# Patient Record
Sex: Female | Born: 1965 | Race: White | Hispanic: No | Marital: Married
Health system: Midwestern US, Academic
[De-identification: ages and names within clinical notes are randomized; demographics above are authoritative.]

---

## 2015-02-16 IMAGING — MG MAMMOGRAM 3D SCREEN BILATERAL
8 series · 8 of 8 positions shown · non-contrast
Comparison: Mammogram September 27, 2010, ultrasound left breast [DATE],

MAMMOGRAM DIGITAL SCREENING BILATERAL
INDICATION: Screening.
History of left biopsy.
Family history of carcinoma of the breast in paternal aunt.
TECHNIQUE: Four standard projections with CAD.
Four standard projections with tomosynthesis.

[R CC]
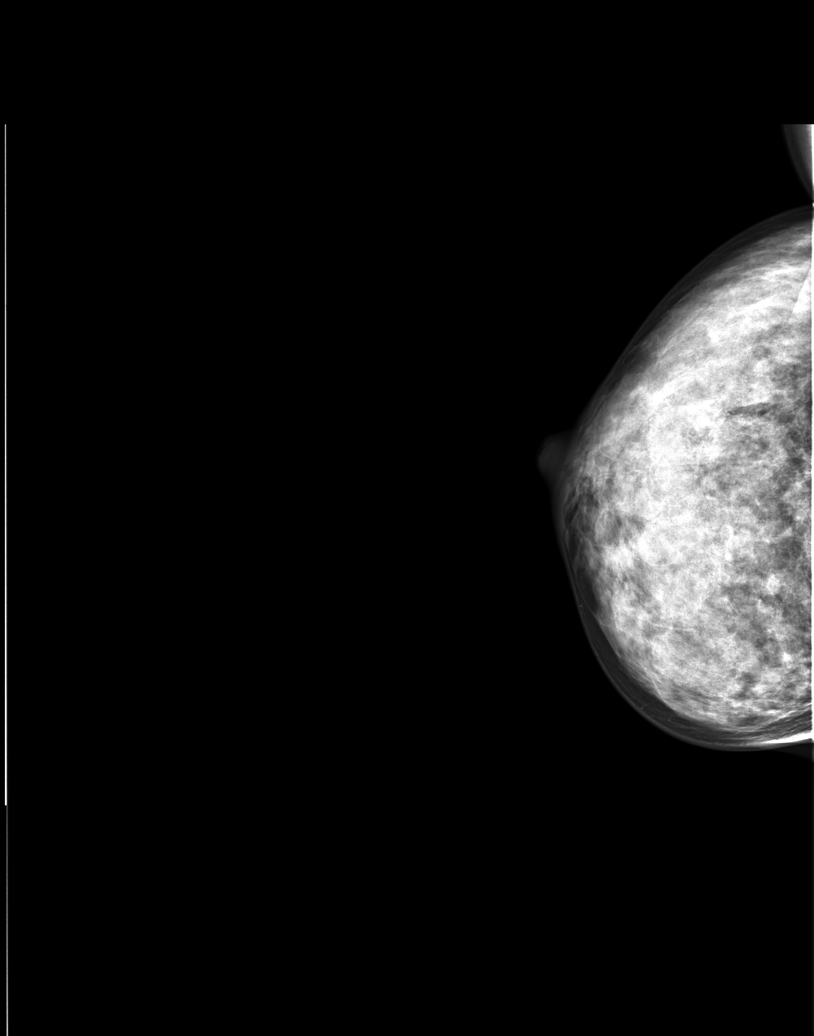

[R tomo (1 of 2)]
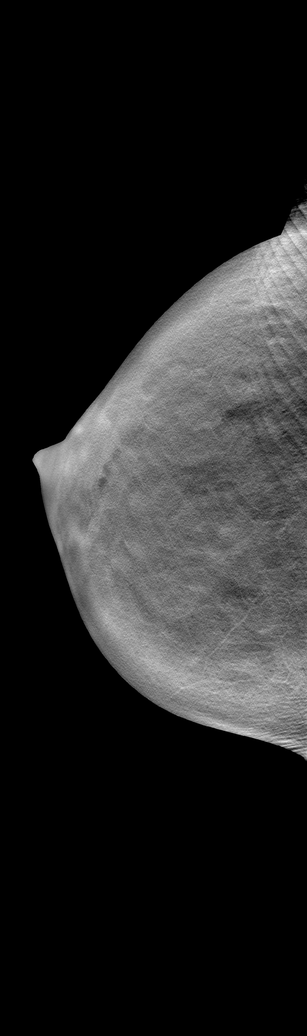

[L CC]
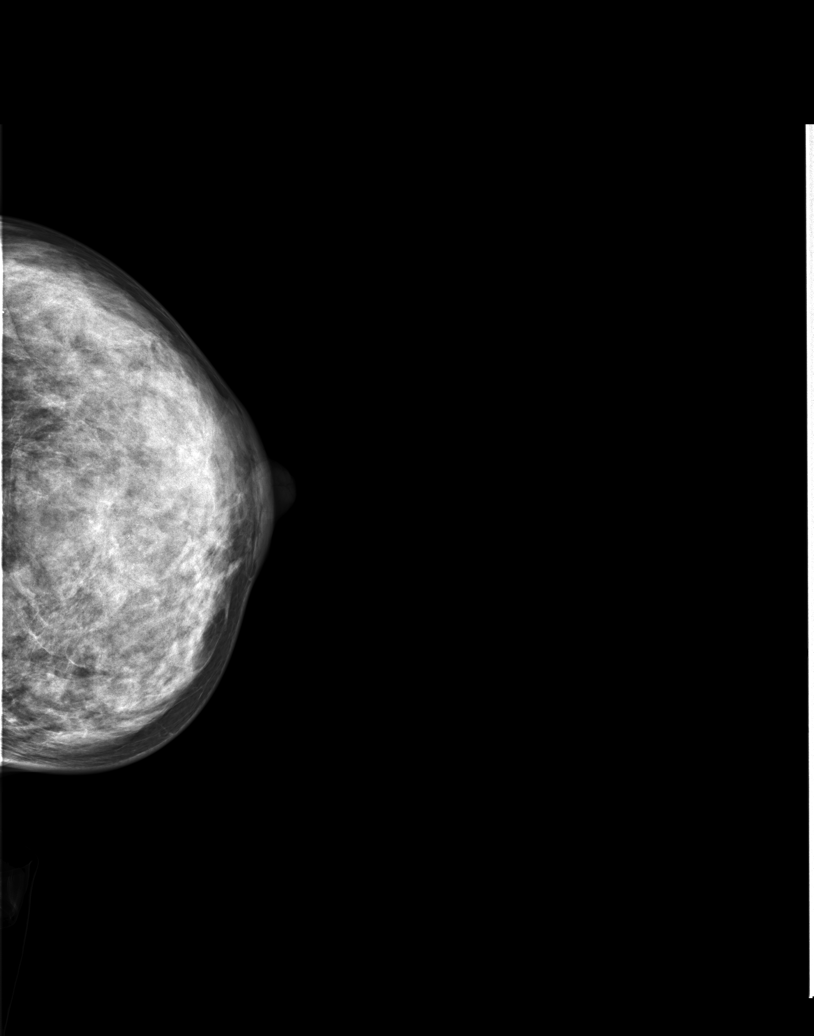

[L tomo (1 of 2)]
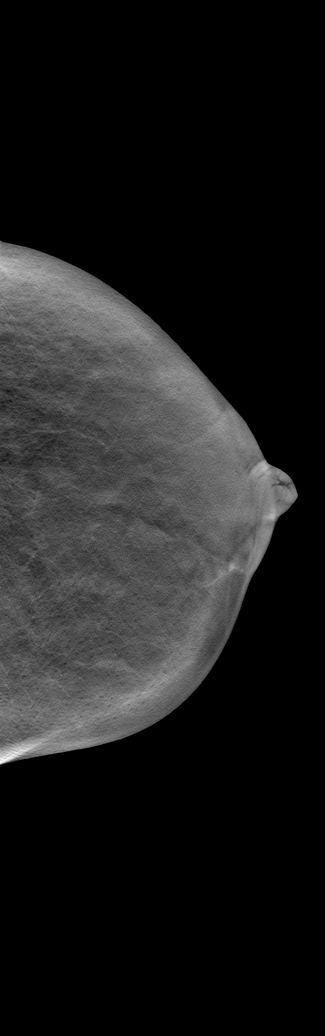

[R MLO]
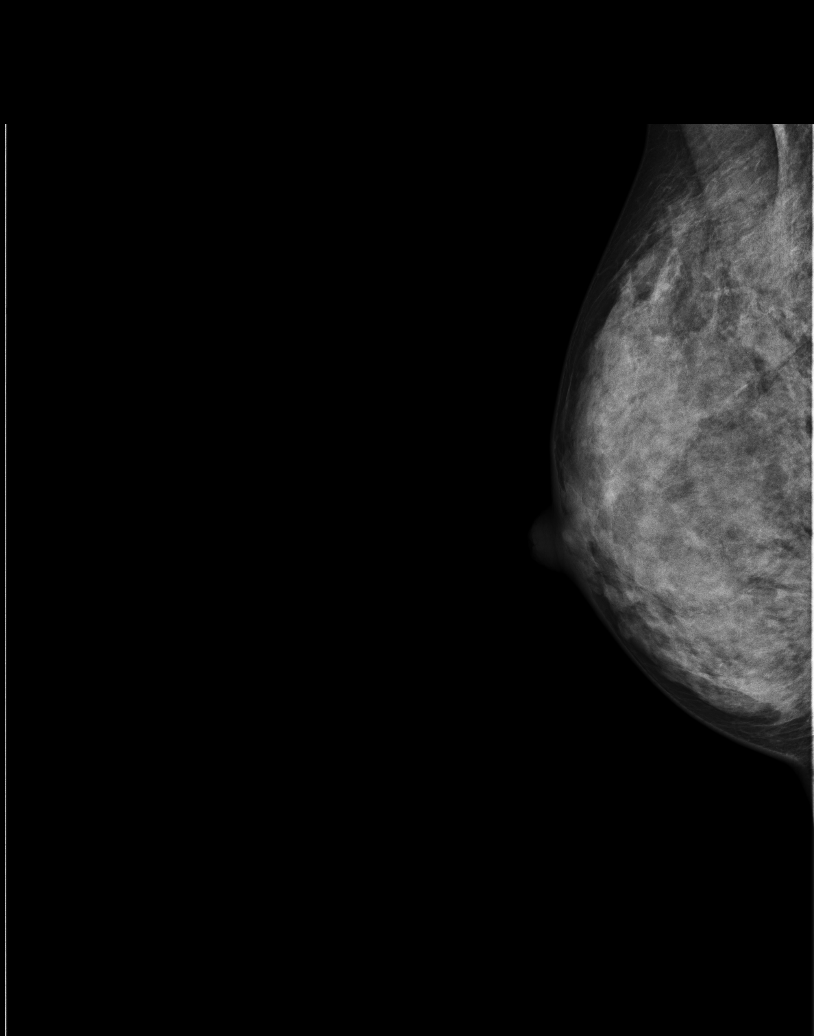

[R tomo (2 of 2)]
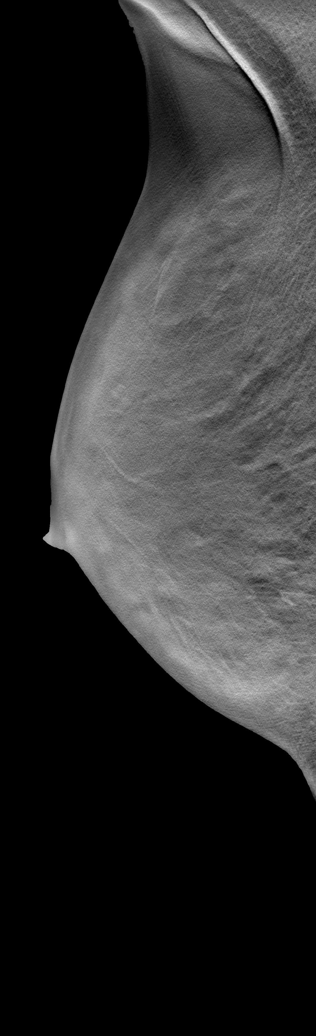

[L MLO]
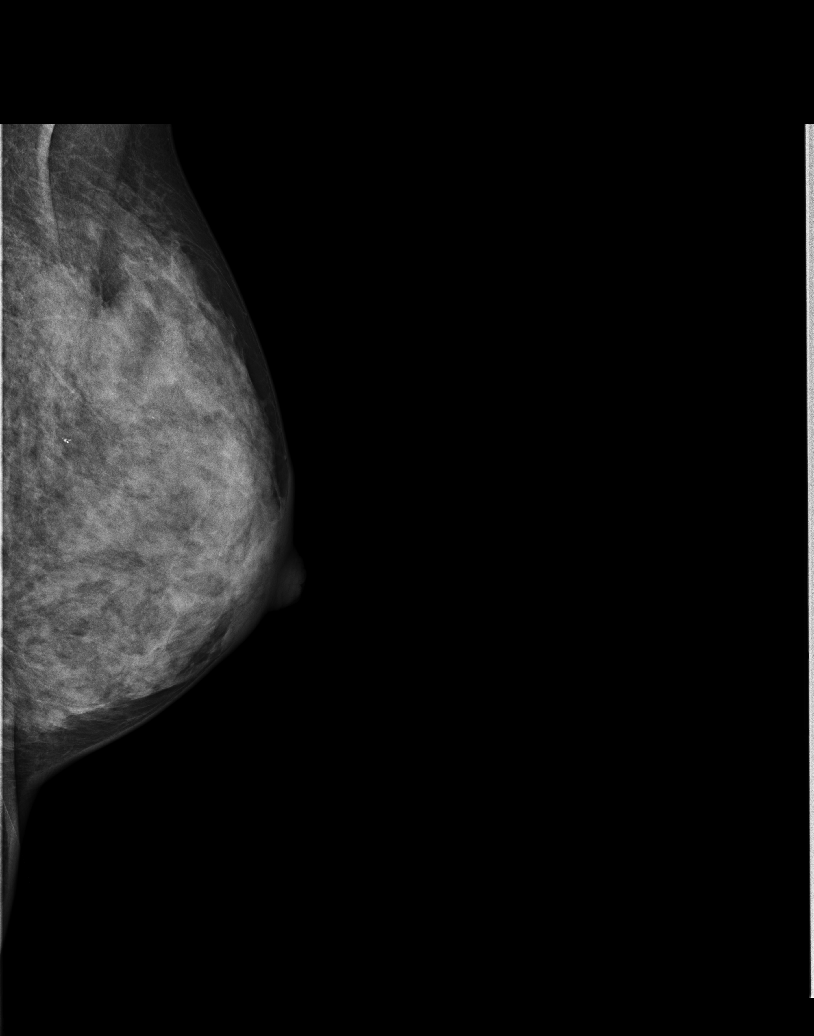

[L tomo (2 of 2)]
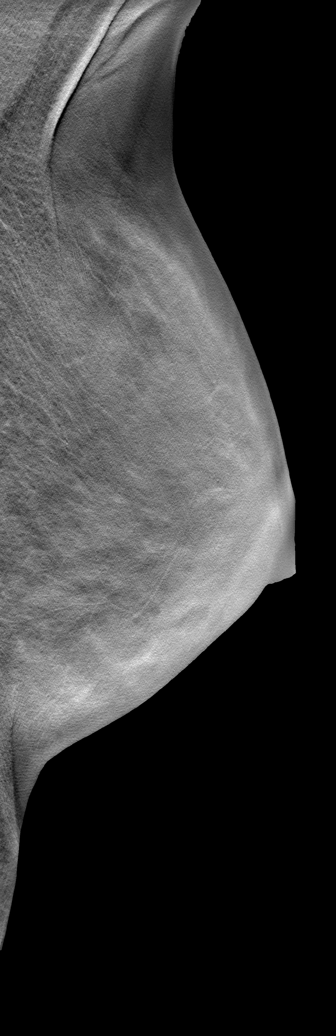

[8 of 8 positions shown; findings below may reference images not displayed]

IMPRESSION: BIRADS 2:  Benign findings
Annual mammographic follow up is recommended.
The absence of a significant mammographic finding should not delay biopsy of
any clinically suspicious lesion.
FINDINGS: There is a stable extremely dense parenchymal pattern.
This significantly reduces the sensitivity of mammography.
There is some scattered involutional calcifications of the left breast.
There is no new, dominant, nor suspicious parenchymal asymmetry,
architectural distortion or calcification.

X-ray dose in mGy 2D/3D
Right CC 1.39/2.77.
Right MLO 1.17/2.33.
Left CC 1.13/2.26.
Left MLO 1.02/2.04.

TRT 10 minutes 5 seconds

Job: [id number]

BI-RADS:  2 - BENIGN FINDING

FOLLOWUP:  12 MONTH FOLLOW-UP

Tech Notes: DENSE BREAST. HX LT BX. PATERNAL AUNT AND MOTHER WITH BREAST CANCER. LM
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
## 2015-09-07 IMAGING — CR CHEST
2 series · 2 of 2 positions shown · non-contrast
Comparison: none

[chest pa]
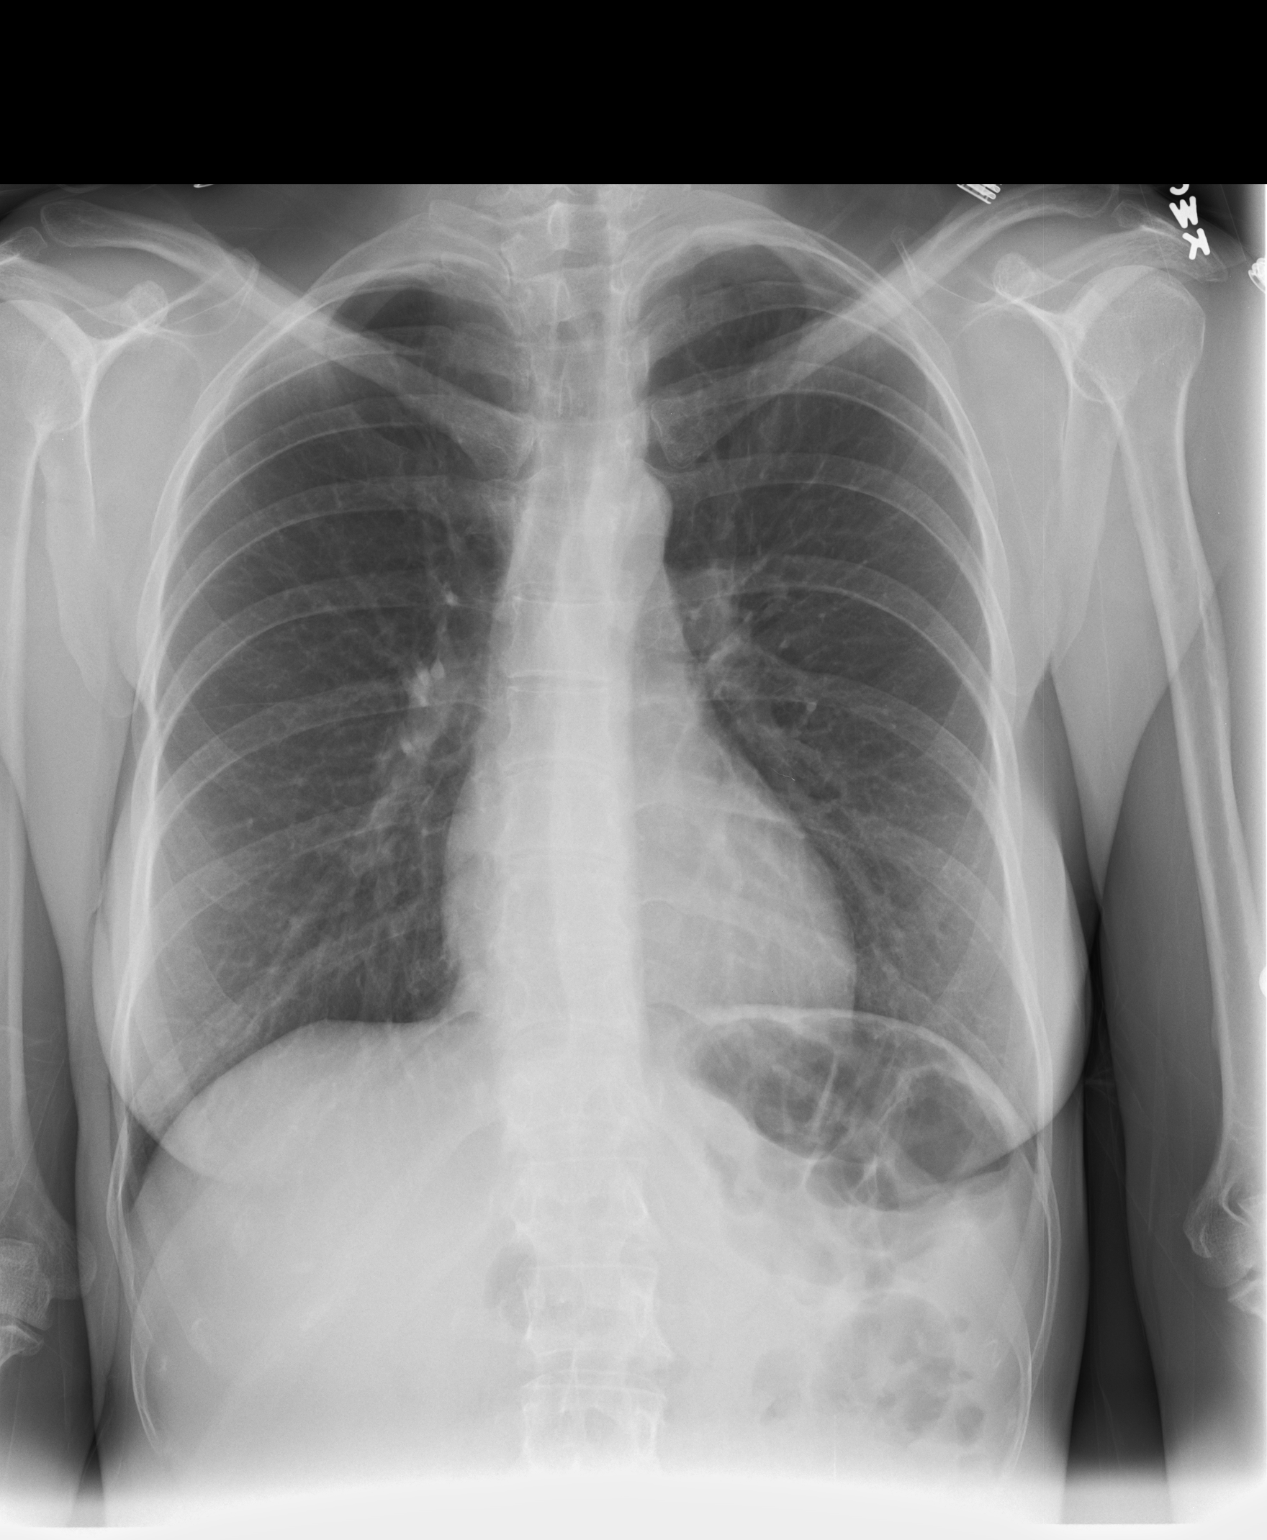

[chest lat]
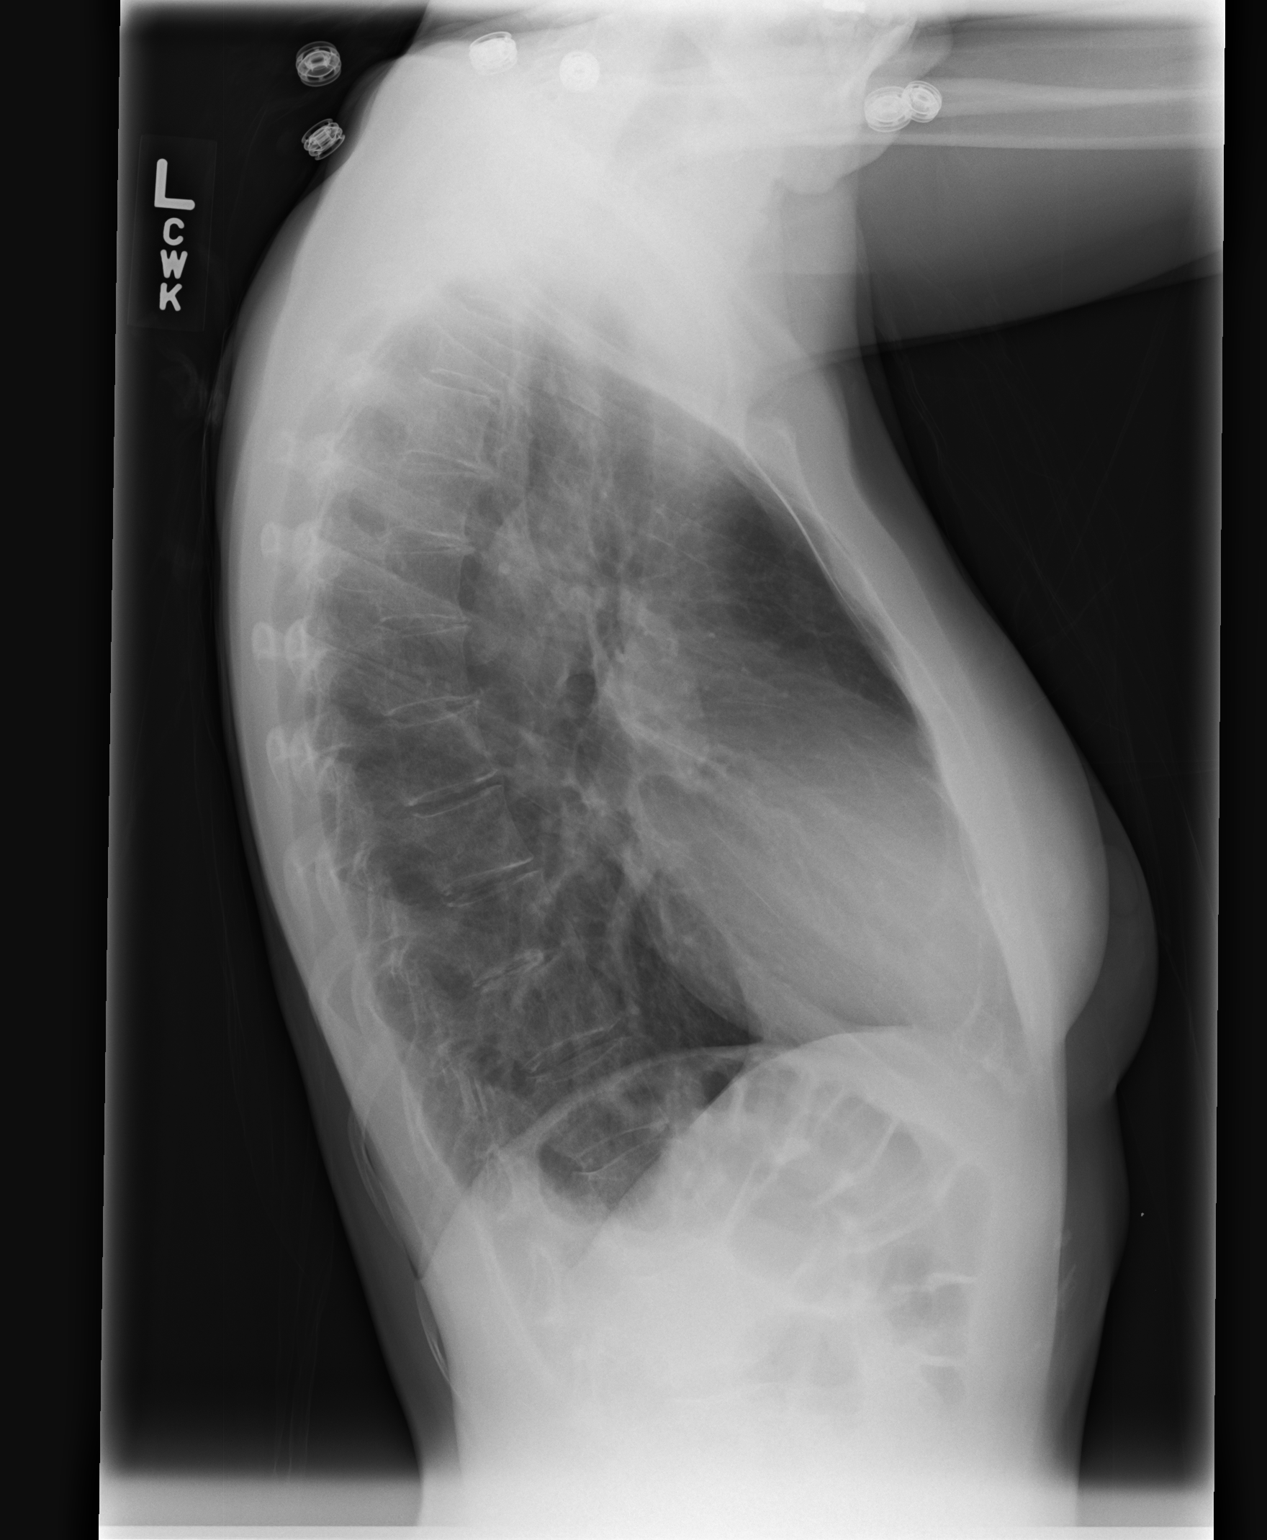

[2 of 2 positions shown; findings below may reference images not displayed]

DIAGNOSTIC STUDIES

EXAM
RADIOLOGICAL EXAMINATION, CHEST; 2 VIEWS FRONTAL AND LATERAL CPT 30151

INDICATION
Syncope
SYNCOPAL EPISODE TODAY. NO KNOWN HEART OR LUNG PROBLEMS. AB

TECHNIQUE
2 views of the chest were acquired.

COMPARISON

FINDINGS
The cardiac silhouette is within normal limits for size. The mediastinum is not widened or
deviated. The lungs are clear.

IMPRESSION
No acute cardiopulmonary process.
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
## 2015-09-14 IMAGING — US US BREAST LT
1 series · 14 of 16 positions shown · non-contrast
Comparison: none

[Series 1: us breast left · 14 of 16 slices shown]
[im 1/16]
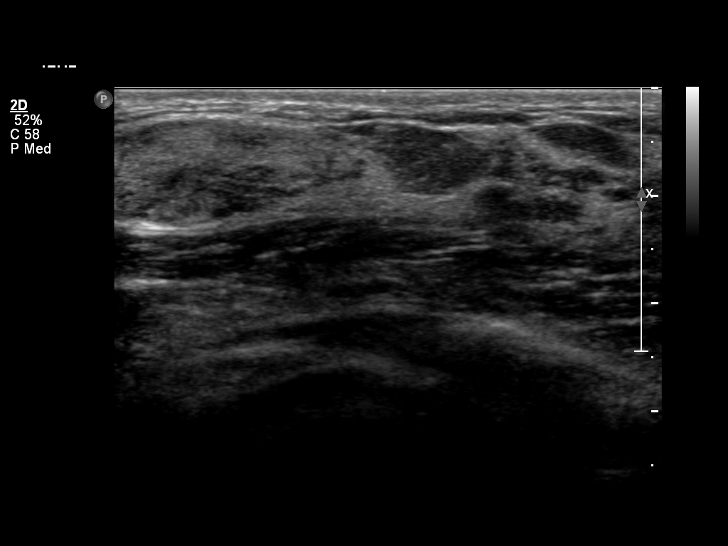
[im 2/16]
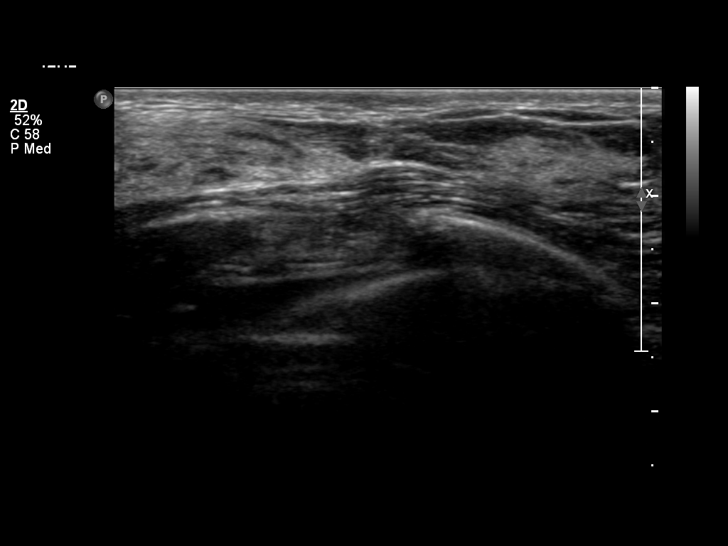
[im 3/16]
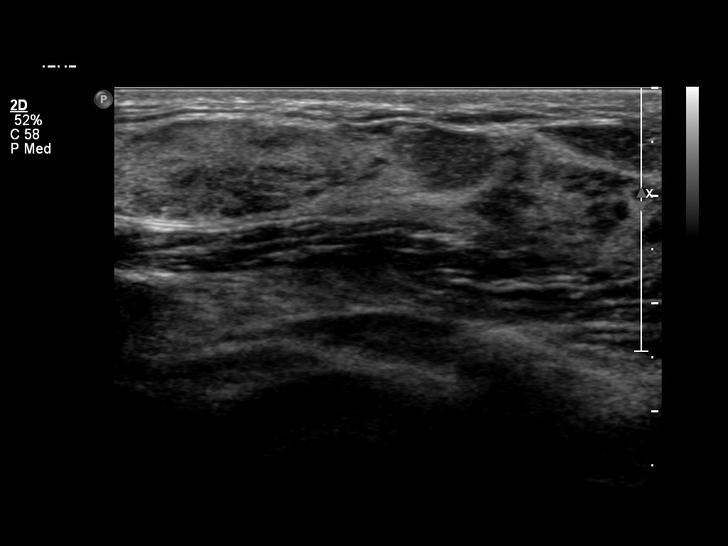
[im 5/16]
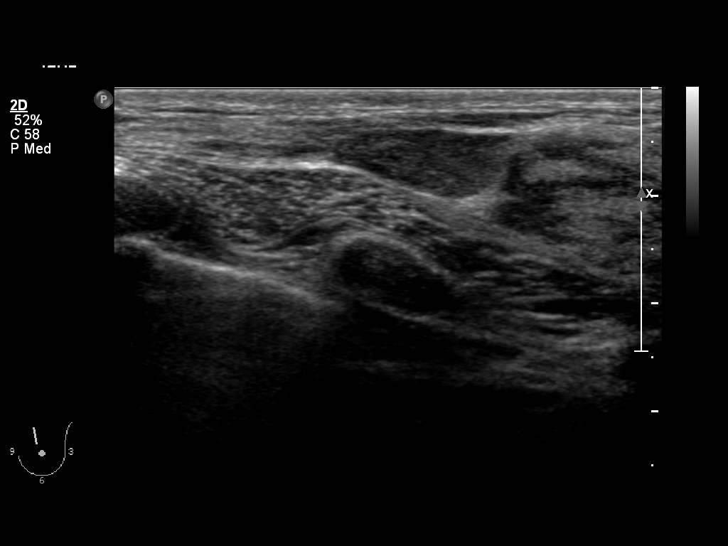
[im 6/16]
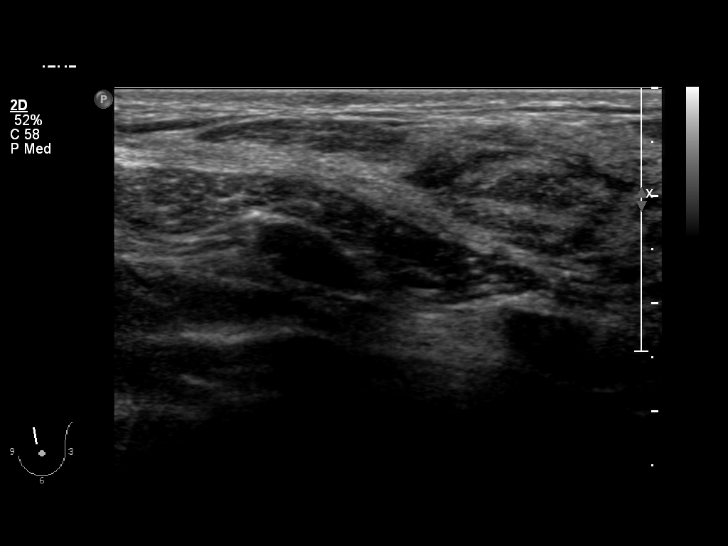
[im 7/16]
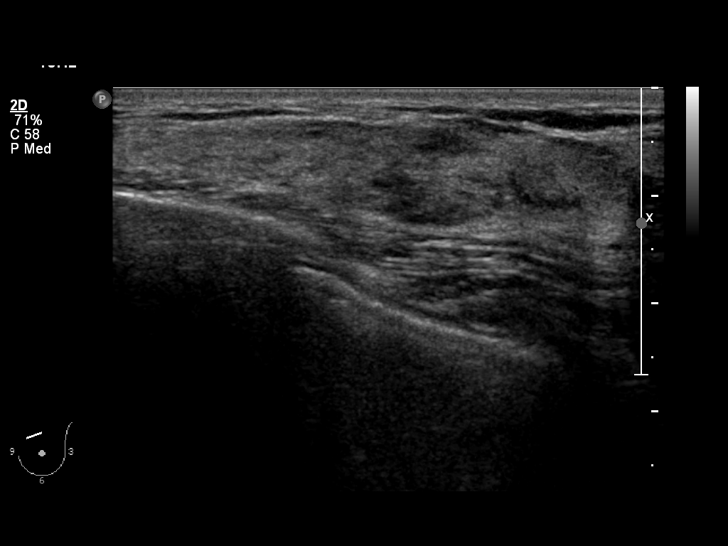
[im 8/16]
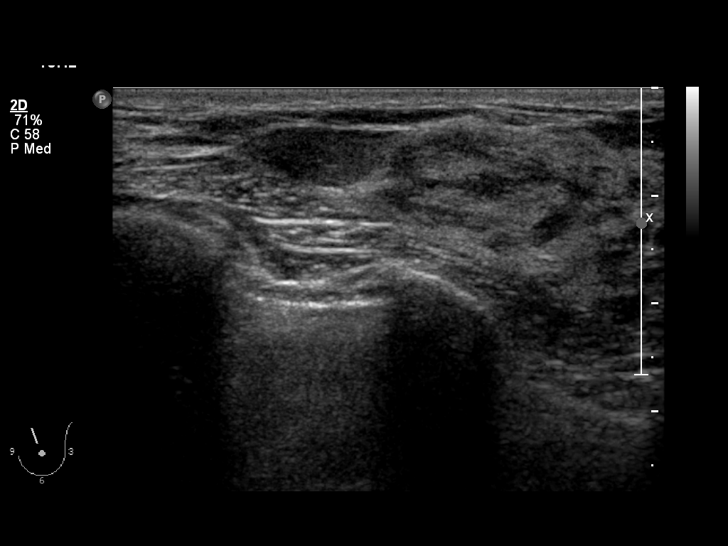
[im 9/16]
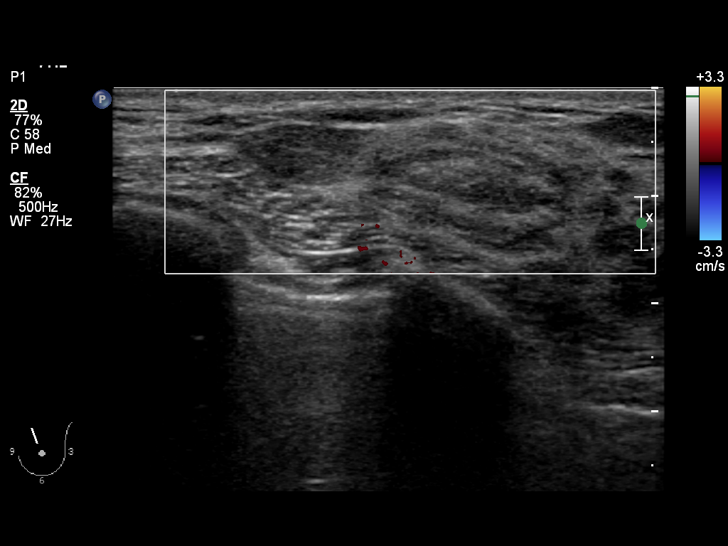
[im 10/16]
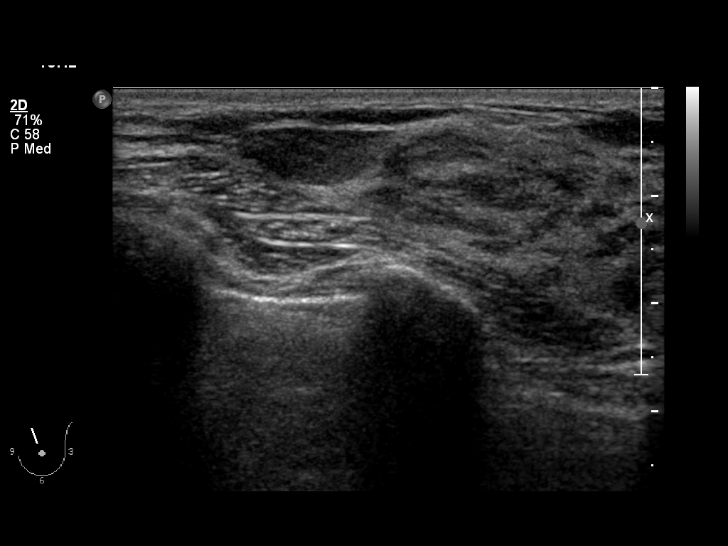
[im 11/16]
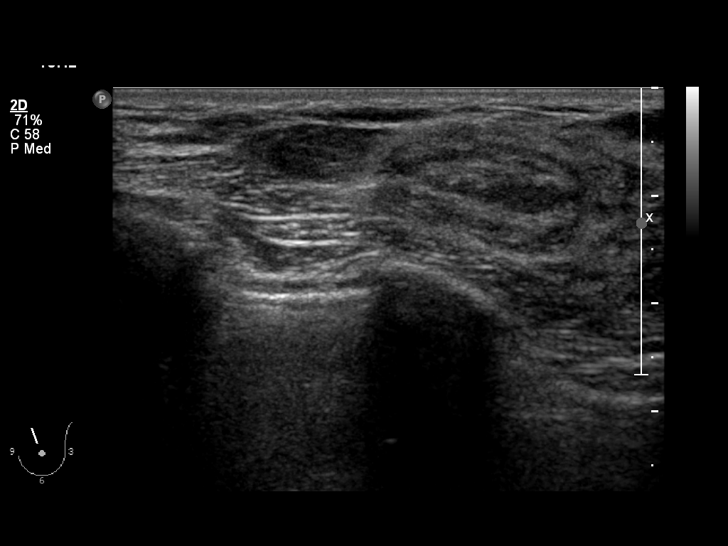
[im 13/16]
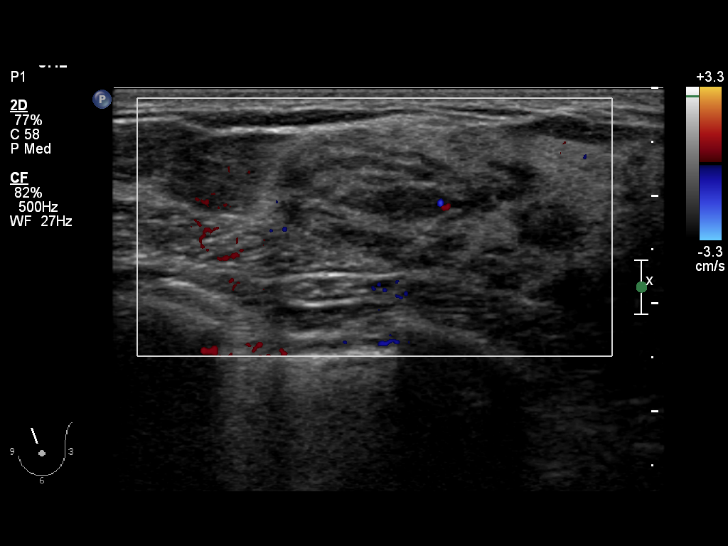
[im 14/16]
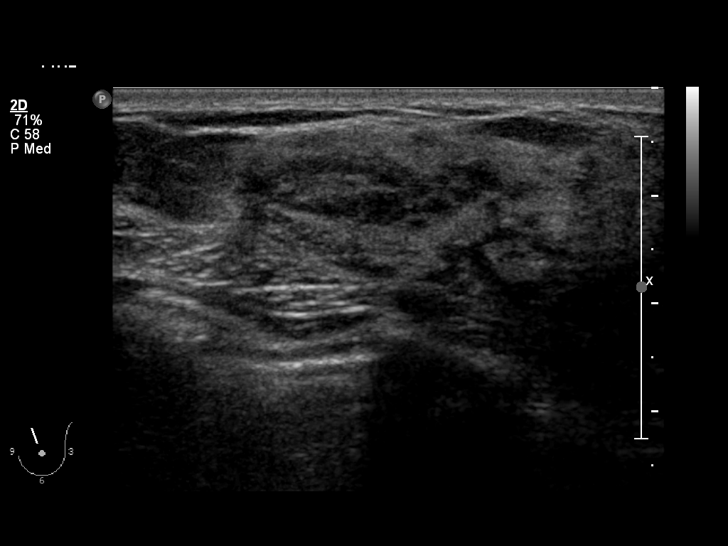
[im 15/16]
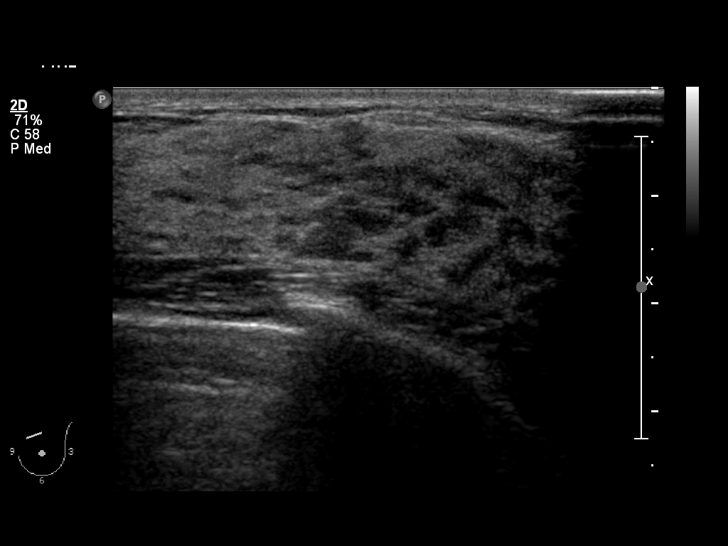
[im 16/16]
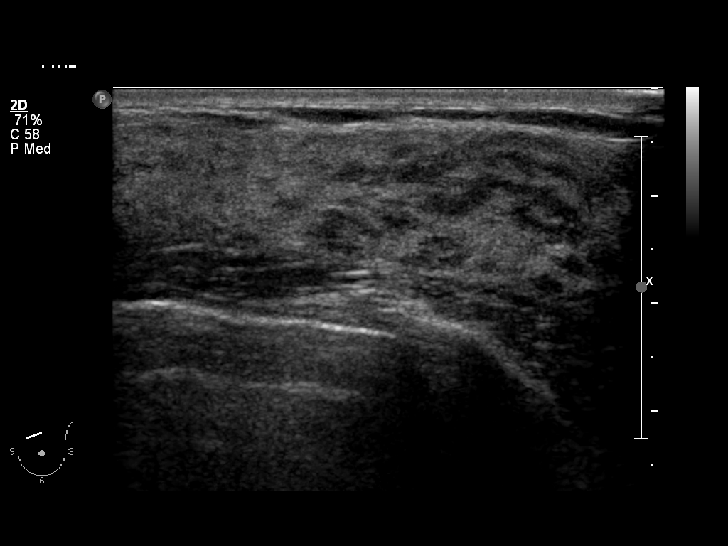

[14 of 16 positions shown; findings below may reference images not displayed]

ULTRASOUND REPORT

DIAGNOSTIC STUDIES

EXAM
Left breast ultrasound

INDICATION
1cm lump [DATE] just under scar from bx
1cm lump left breast x 2months. (under bx scar from 2228) FM HX breast ca;
mother and aunt. PREV MAMM 02/16/15.
Prev US 4434, but of 5533 retroaerolar area.

TECHNIQUE
Directed ultrasound left breast was obtained at the site of patient's palpable abnormality at 11 o'
clock 7 centimeters from the nipple.

COMPARISON
None

FINDINGS
No concerning cystic or solid lesions are noted.

IMPRESSION
Negative left breast ultrasound. Continued clinical follow-up is recommended. If this becomes more
concerning repeat imaging may be necessary. Ultimately, a negative report should not deter biopsy
of a clinically suspicious palpable abnormality.

## 2016-05-27 IMAGING — CT Thorax^1_Chest_Abd_Pelvis_WITH (Adult)
1 series · 14 of 32 positions shown, 18 images · non-contrast
Comparison: none

[Series 2: ch,a/p 5.0 soft tissue · axial · 0.63mm/px · z∈[-542,-42]mm · 14 of 111 slices shown, 18 images]
[im 8/111  soft-tissue]
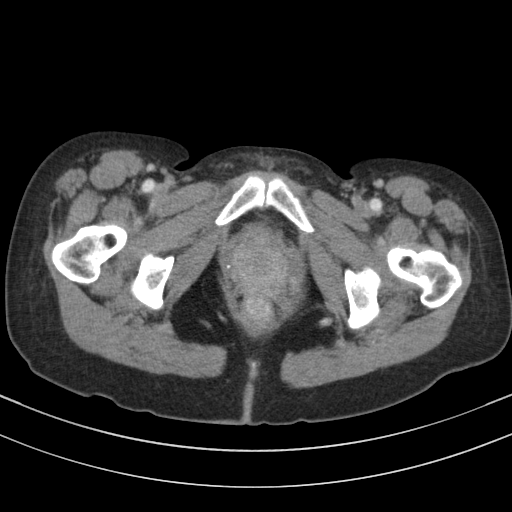
[im 8/111  bone]
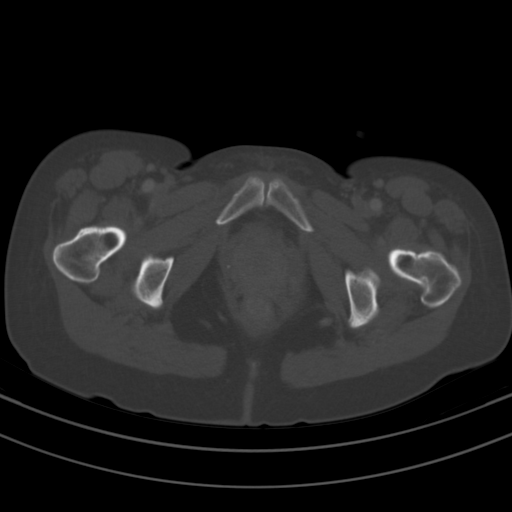
[im 15/111  soft-tissue]
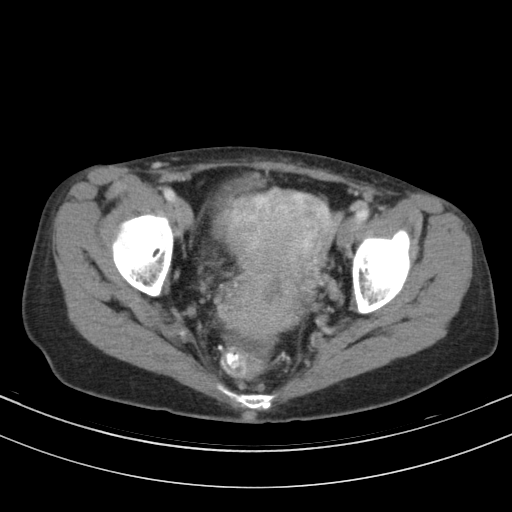
[im 25/111  soft-tissue]
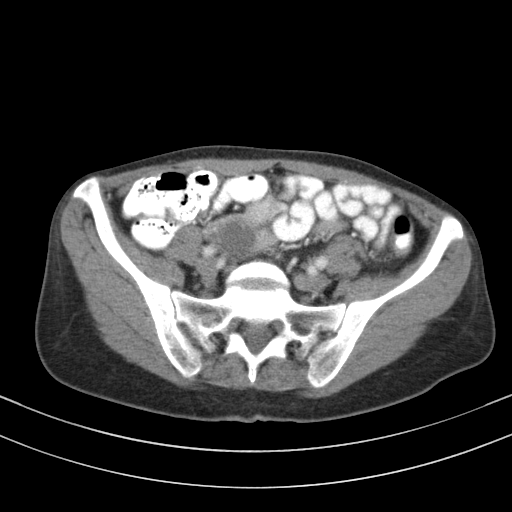
[im 32/111  soft-tissue]
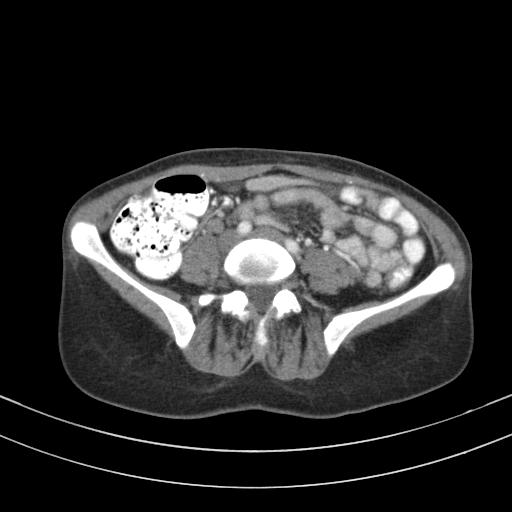
[im 43/111  soft-tissue]
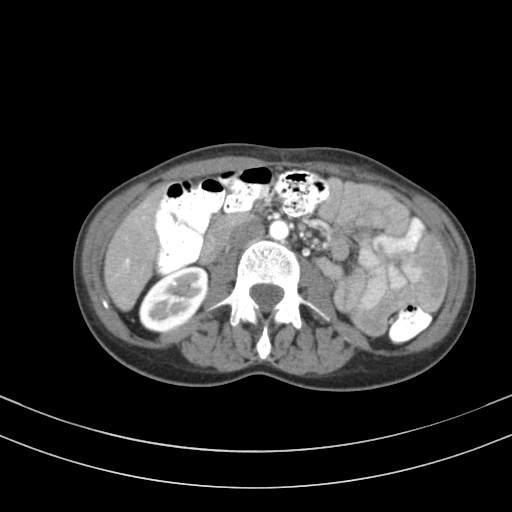
[im 50/111  soft-tissue]
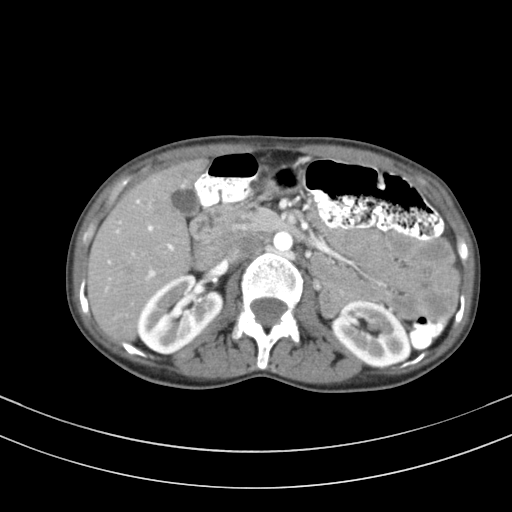
[im 61/111  soft-tissue]
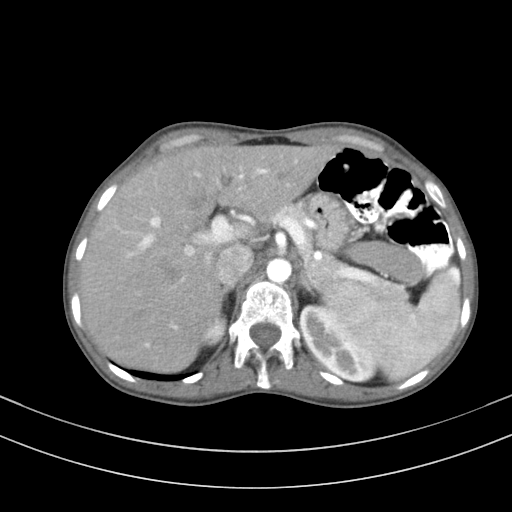
[im 68/111  soft-tissue]
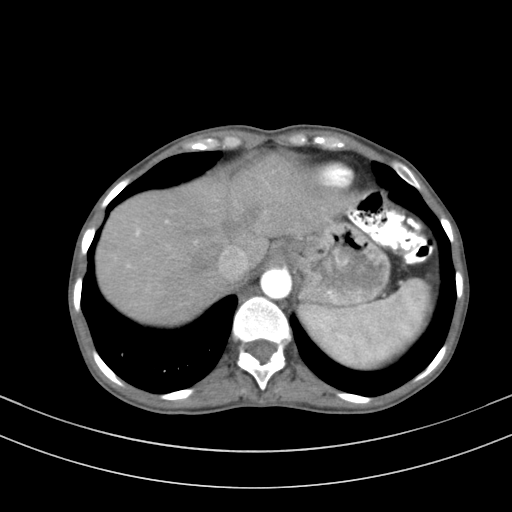
[im 79/111  soft-tissue]
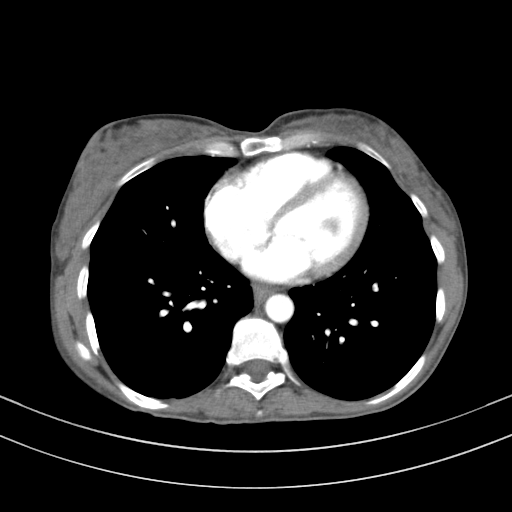
[im 79/111  bone]
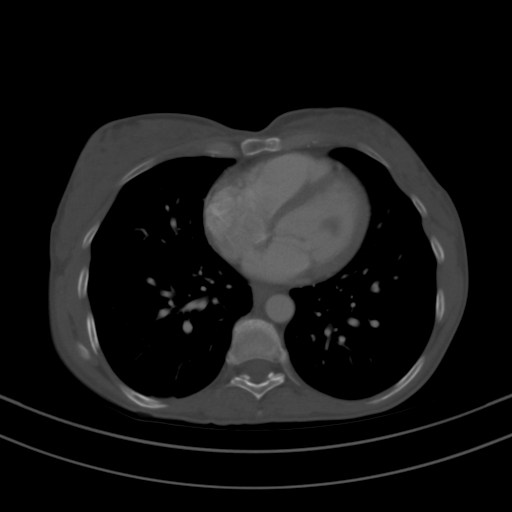
[im 86/111  soft-tissue]
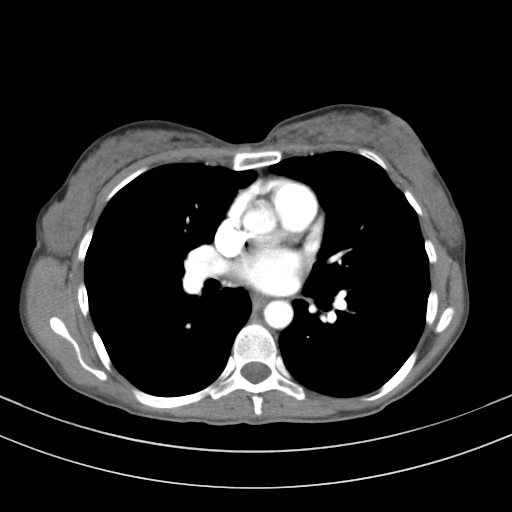
[im 96/111  soft-tissue]
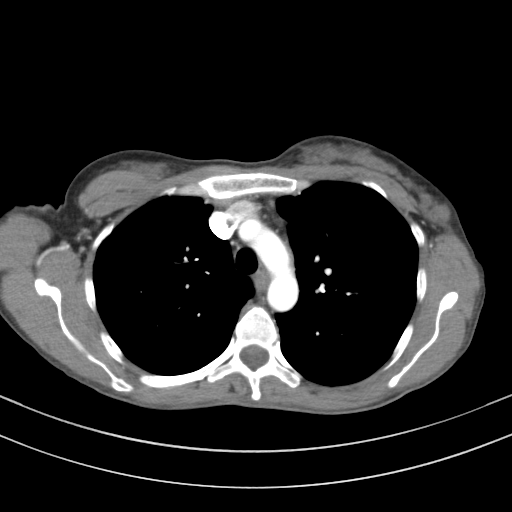
[im 96/111  lung]
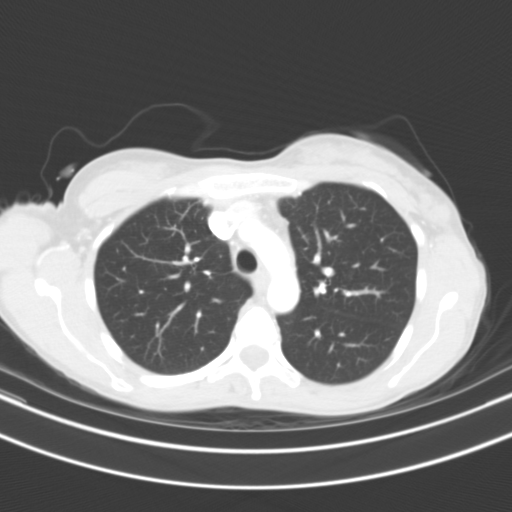
[im 100/111  lung]
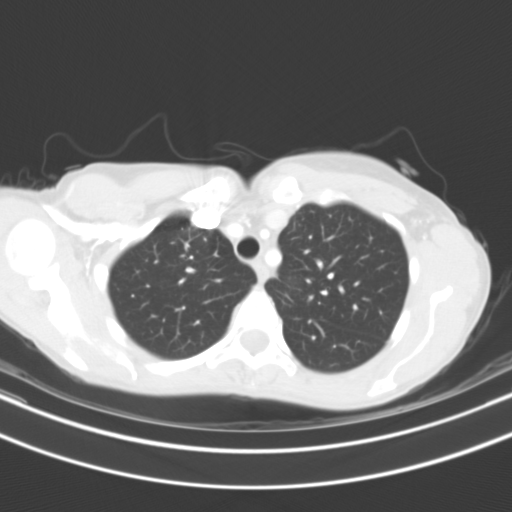
[im 103/111  soft-tissue]
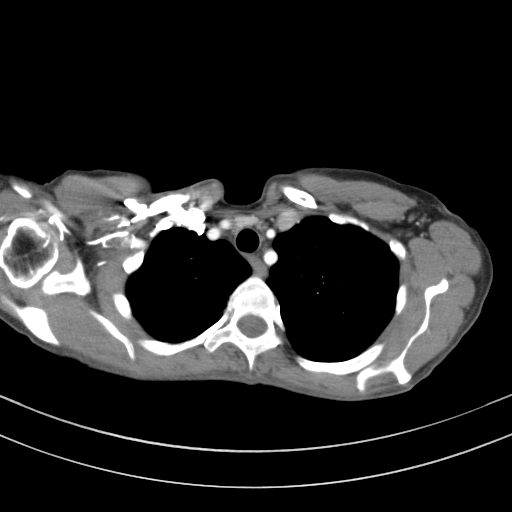
[im 103/111  lung]
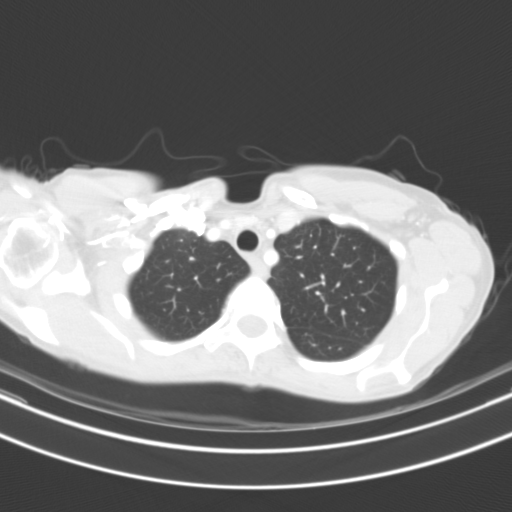
[im 107/111  lung]
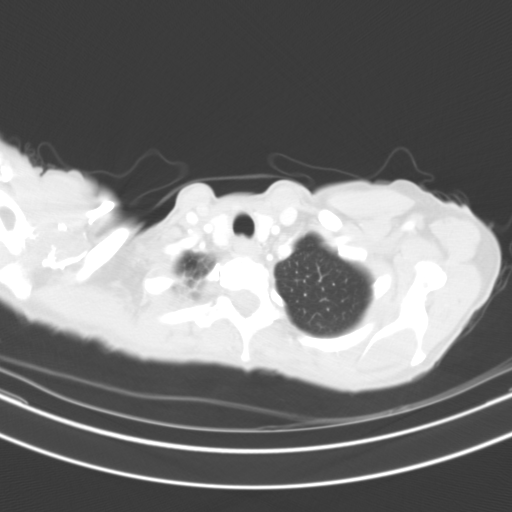

[14 of 32 positions shown; findings below may reference images not displayed]

DIAGNOSTIC STUDIES

EXAM
CT chest, abdomen, and pelvis with contrast

INDICATION
Unexplained weight loss of 50-year-old female. Family history mother with breast cancer.

TECHNIQUE
Contiguous transaxial imaging through the chest, abdomen, and pelvis were obtained during the
uneventful administration of80 milliliter Omnipaque 244low osmolar intravenous iodinated contrast
material. Coronal and sagittal reformations were obtained from the transaxial source data.

All CT scans at this facility use dose modulation, iterative reconstruction, and/or weight based
dosing when appropriate to reduce radiation dose to as low as reasonably achievable.

COMPARISONS
No comparison is available

FINDINGS
Chest:
Thyroid gland is unremarkable. No thoracic lymphadenopathy. Great vessel origins are patent.
Thoracic aorta is normal course and caliber. Thoracic esophagus normal course and caliber. No large
central filling defects in the pulmonary arteries. Heart size is normal without pericardial
effusion.
The lungs are clear without suspicious pulmonary nodule or mass. There is no evidence of
pneumonia. No focal pleural nodularity or thickening. No pleural effusion. Major central airways
are clear without evidence of bronchiectasis.
Very mild degenerative changes of the thoracic spine are evident without evidence of destructive
bony lesion or acute bony abnormality.

The liver and spleen are normal size and contour without focal lesion. There is no radiopaque
cholelithiasis or biliary ductal dilatation. No adrenal masses and the pancreas is unremarkable.
The kidneys concentrate iodinated contrast material normally and are without enhancing mass or
hydronephrosis.

The abdominal aorta and inferior vena cava are normal caliber. The urinary bladder is distended
and nonopacified. The uterus is anteverted. There is a right adnexal cyst measuring 2.6 x
centimeter, likely benign. There is no abdominopelvic lymphadenopathy. There is minimal free pelvic
fluid, likely physiologic. The large and small bowel loops are normal caliber. Normal appendix.

No destructive bone lesion is identified.

IMPRESSION
Chest:
1. No thoracic lymphadenopathy or evidence of pulmonary metastatic disease.
Abdomen/pelvis:
1. No abdominopelvic lymphadenopathy or evidence of concerning hepatic lesion.
2. Small right adnexal cyst in minimal free pelvic fluid are likely benign and physiologic.

## 2017-06-03 IMAGING — MG MAMMOGRAM 3D SCREEN, BILATERAL
12 of 17 series · 12 of 17 positions shown · non-contrast
Comparison: none

[R CC (1 of 2)]
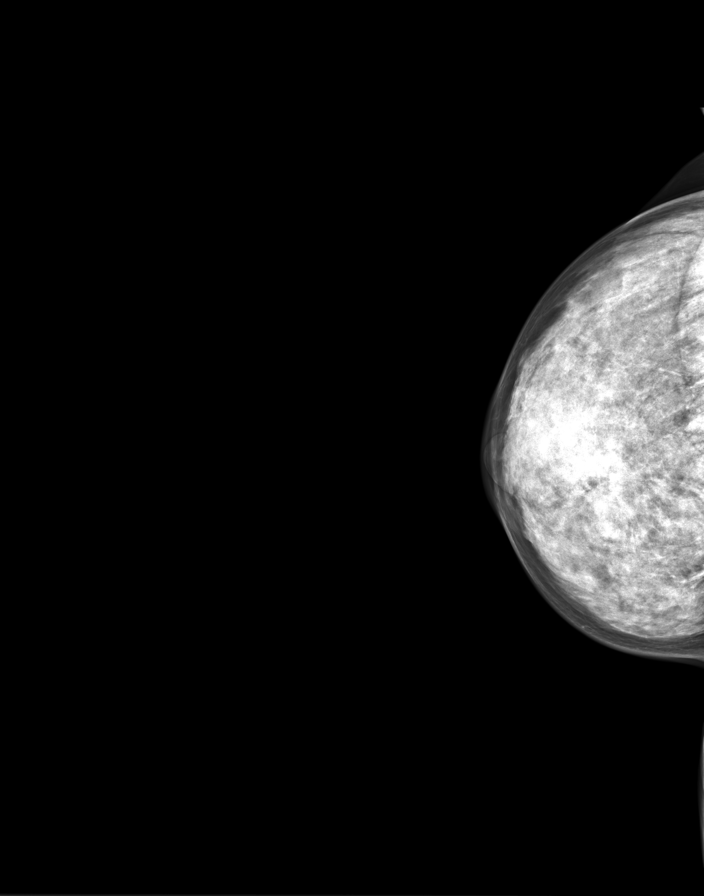

[R tomo (1 of 2)]
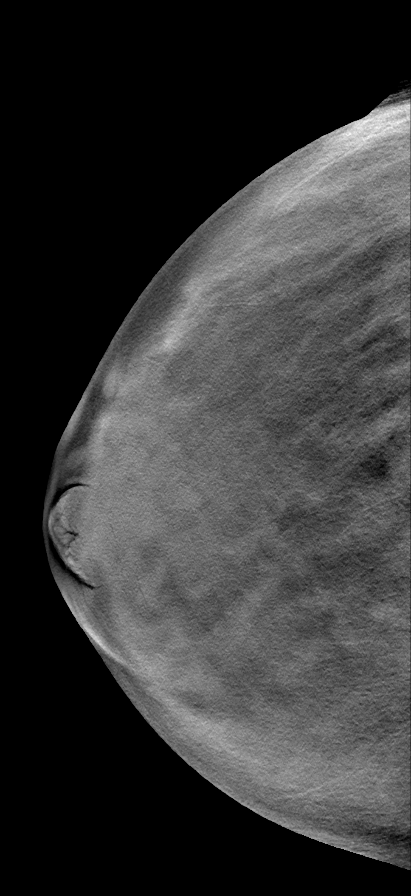

[R CC (2 of 2)]
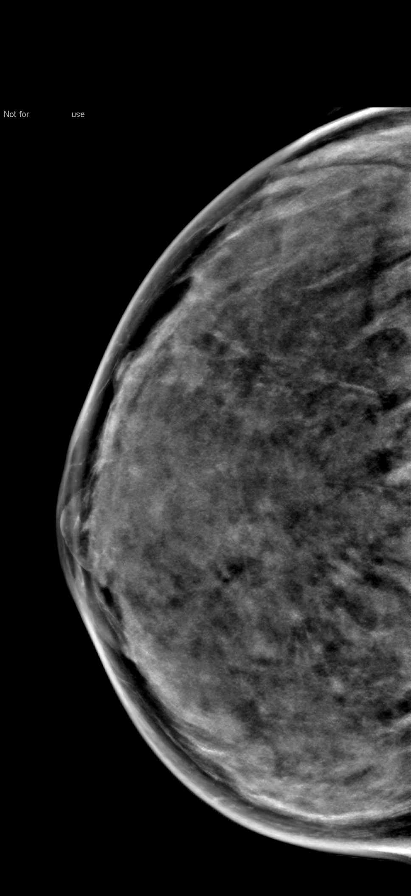

[R (1 of 2)]
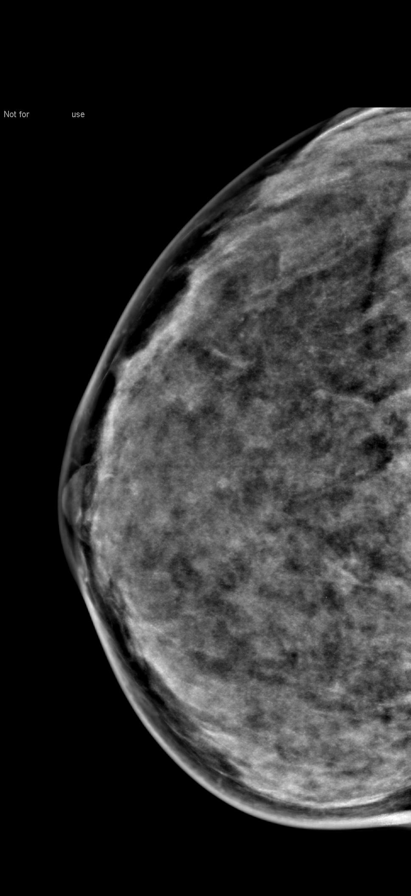

[L CC (1 of 2)]
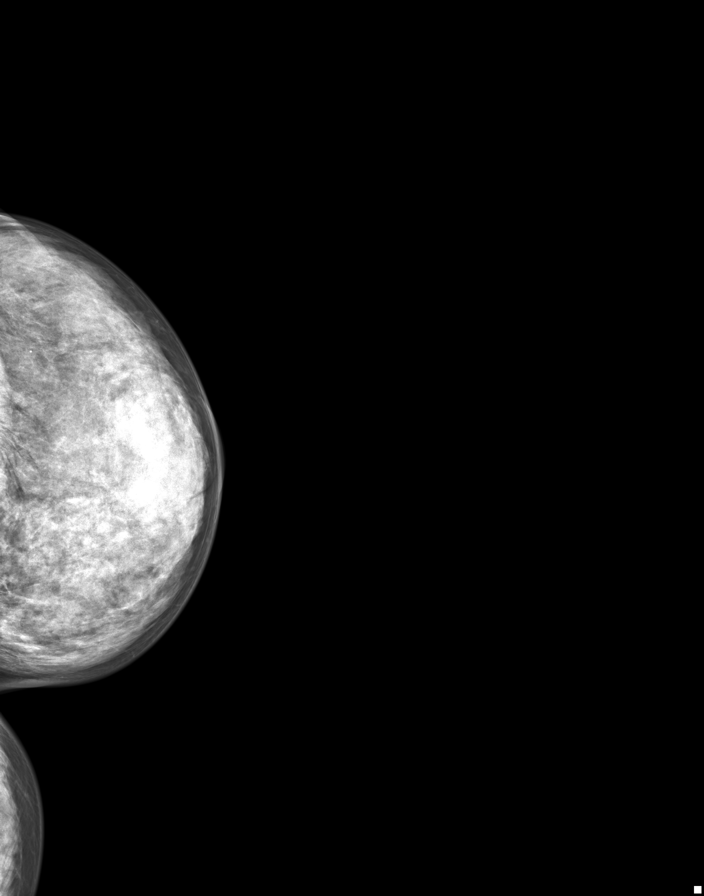

[L tomo]
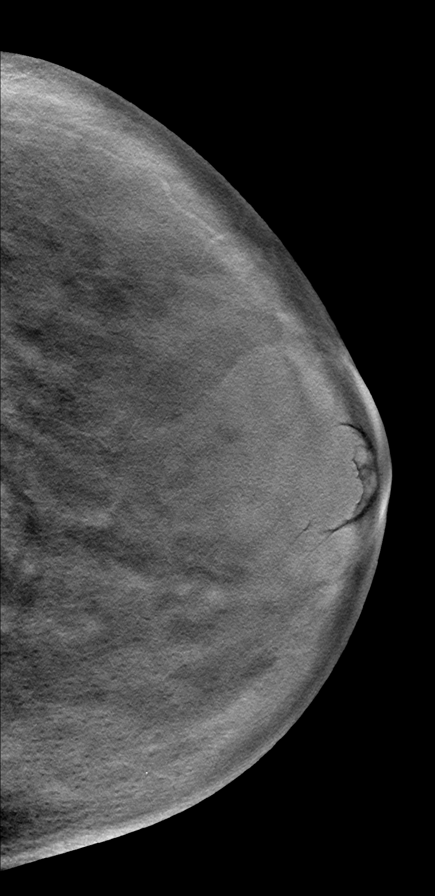

[L CC (2 of 2)]
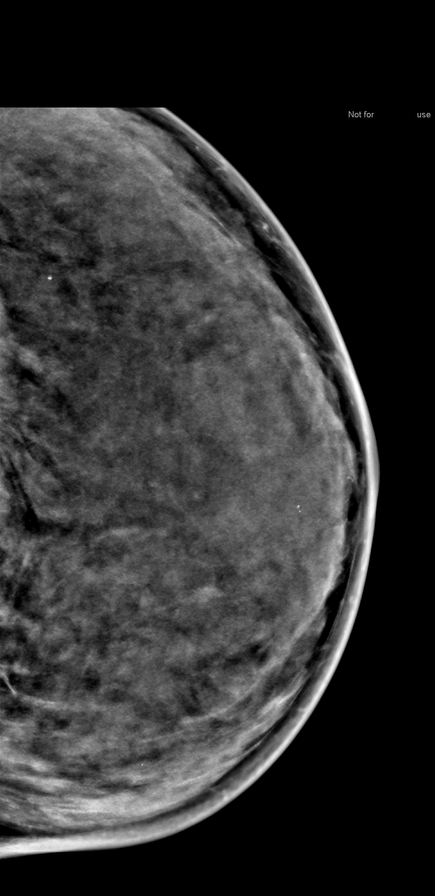

[L]
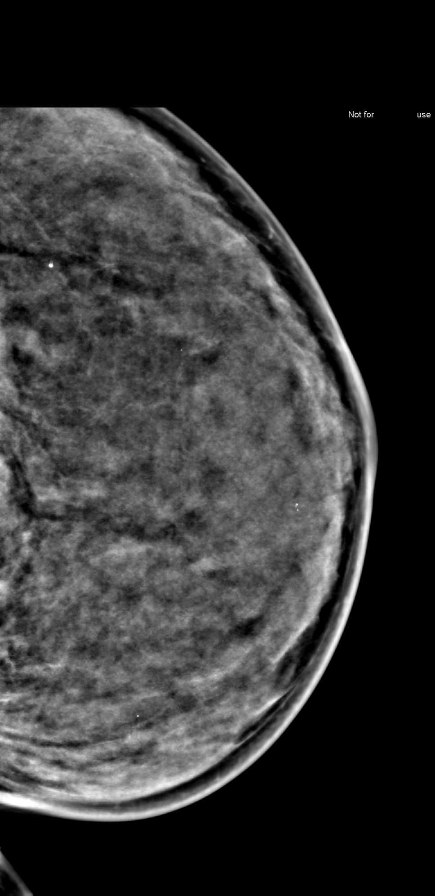

[R MLO (1 of 2)]
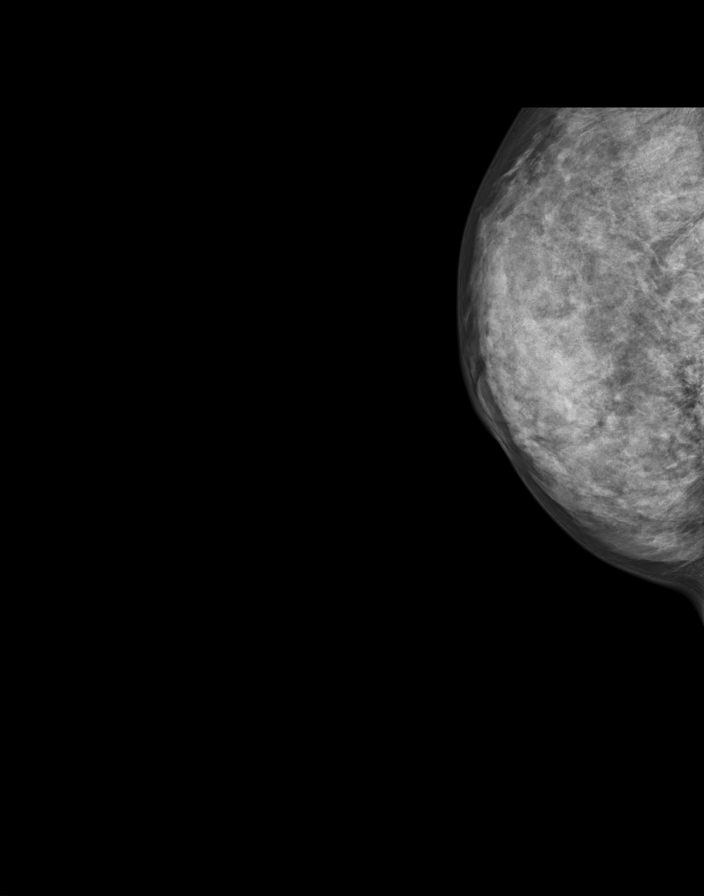

[R tomo (2 of 2)]
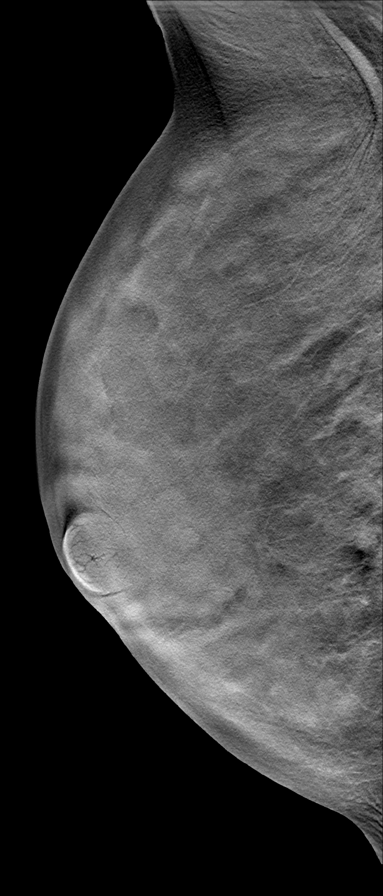

[R MLO (2 of 2)]
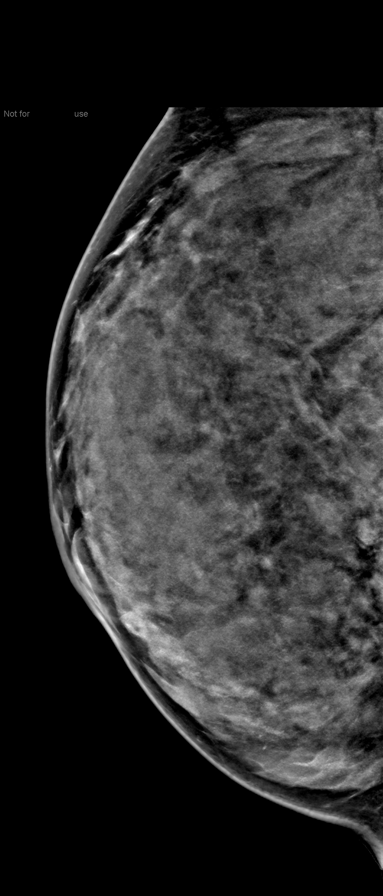

[R (2 of 2)]
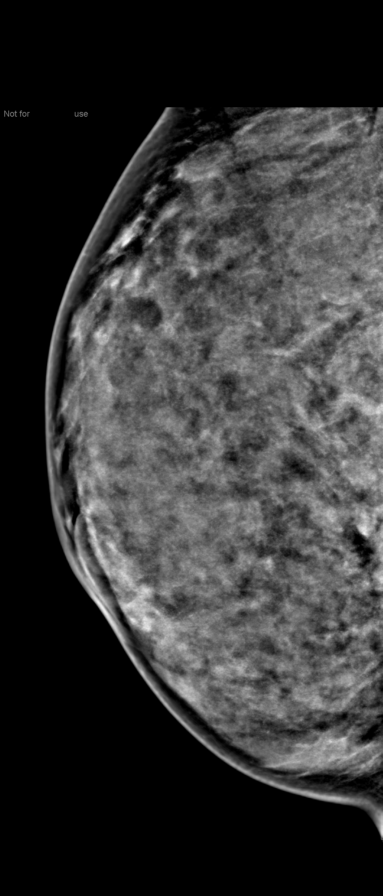

[12 of 17 positions shown; findings below may reference images not displayed]

EXAM

Digital screening mammogram with 3D breast tomography.

INDICATION

SCREENING MAM
HX. BENIGN SURGICAL BX LT BREAST 2666. MOTHER DX @ 74, PATERNAL AUNT DX @
35. SCREENING. AB (3D) PRIORS: [id number], 5917.

FINDINGS

The prior study was reviewed from 05/14/2016.

Digital 2D CC and MLO projections obtained with 3D tomographic views per manufacturer's protocol.
ICAD version 7.2 was used during this exam.

The breasts are extremely dense, which limits mammographic sensitivity in detecting malignancy.

There are scattered benign calcifications in each breast which appear unchanged. There are no
dominant masses or suspicious calcifications.

IMPRESSION

Benign mammogram with 3D breast tomography. BI-RADS 2. Screening mammography in 12 months is
recommended. A followup letter will be scheduled.
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
## 2018-07-23 IMAGING — MG MAMMOGRAM 3D SCREEN, BILATERAL
12 of 17 series · 12 of 17 positions shown · non-contrast
Comparison: none

[R CC (1 of 2)]
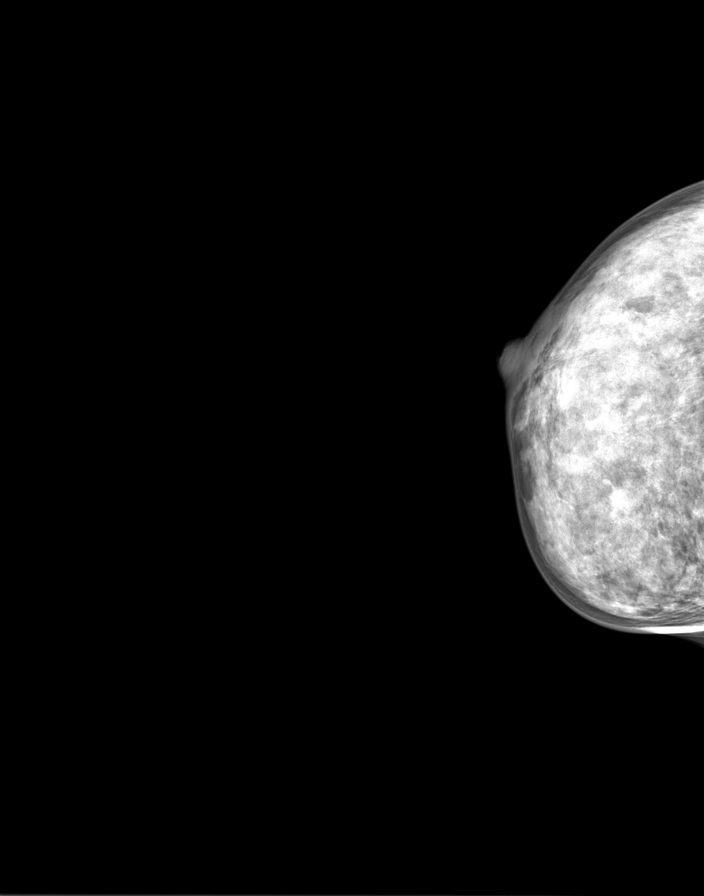

[R tomo (1 of 2)]
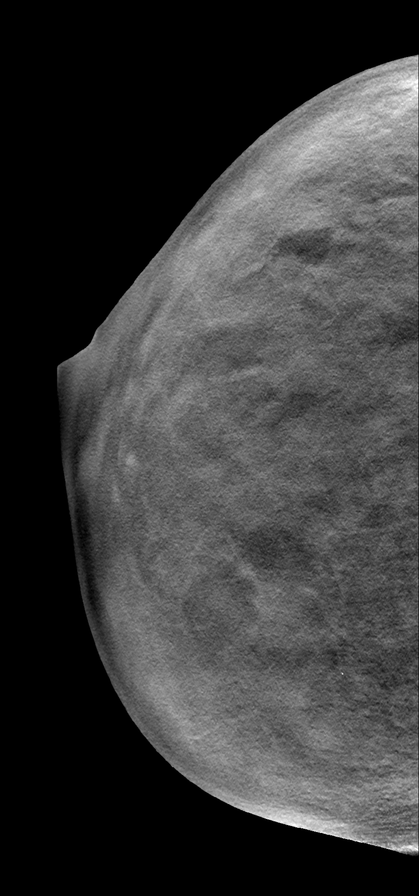

[R CC (2 of 2)]
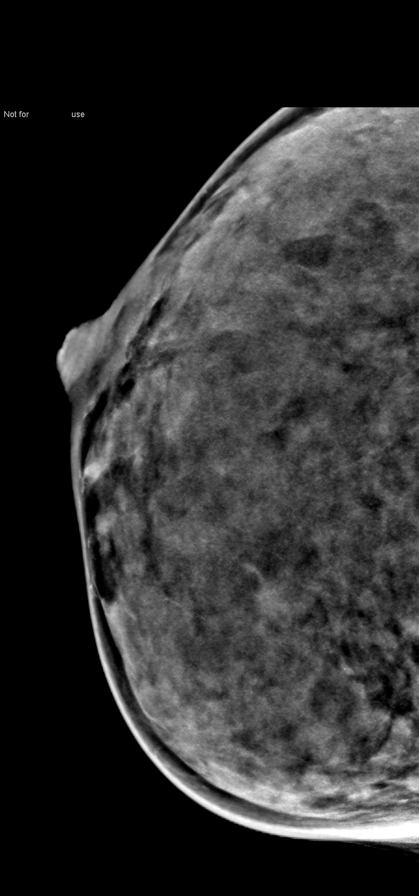

[R (1 of 2)]
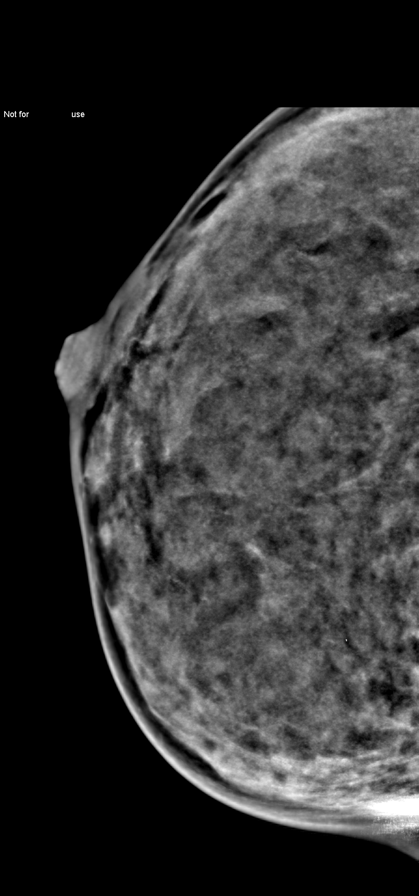

[L CC (1 of 2)]
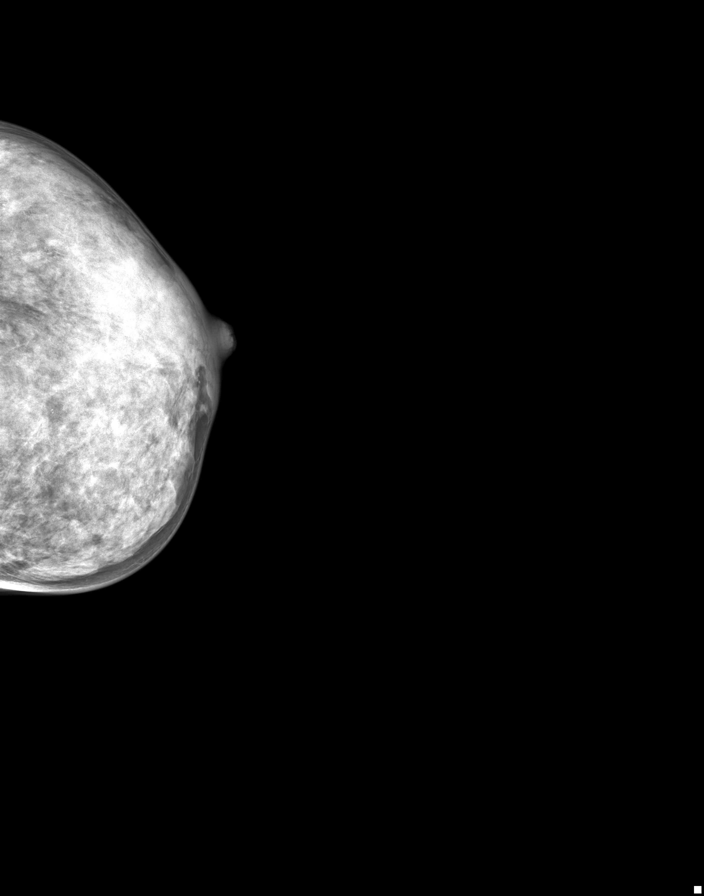

[L tomo]
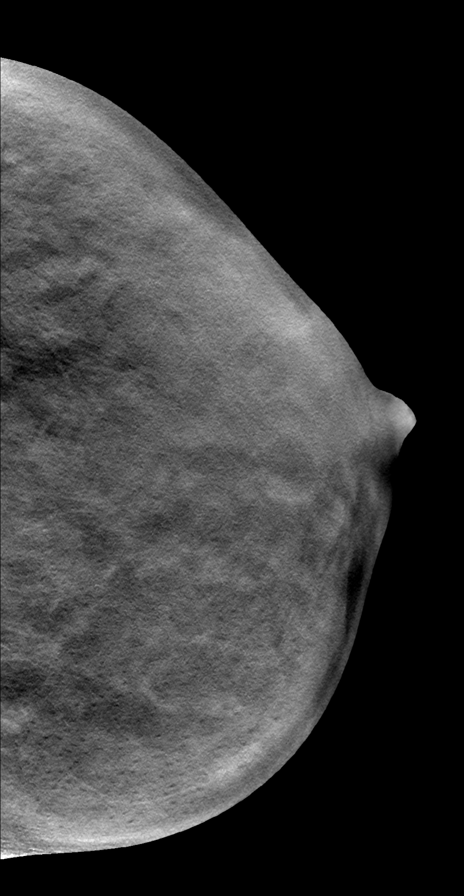

[L CC (2 of 2)]
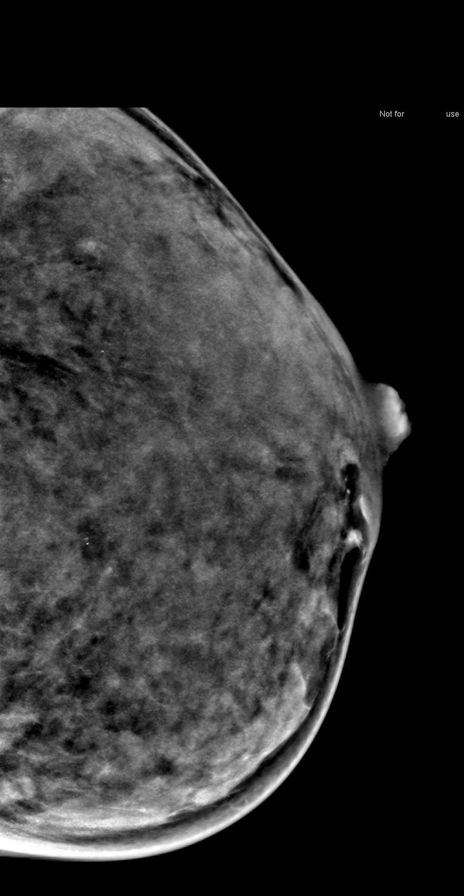

[L]
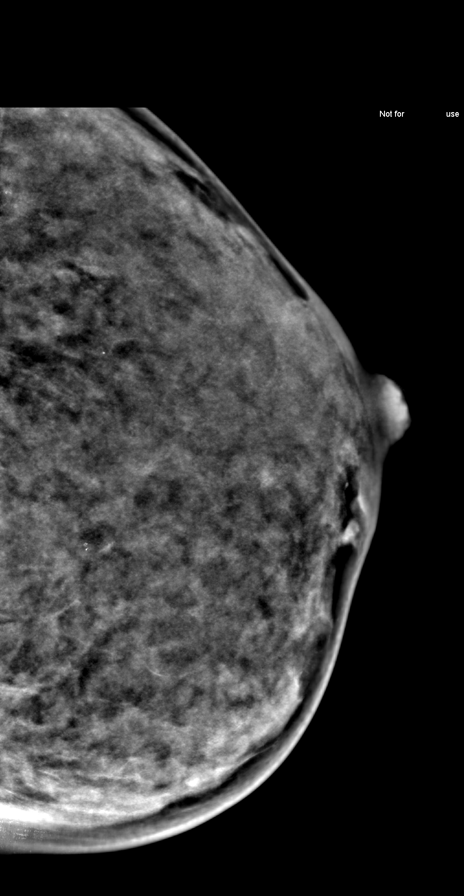

[R MLO (1 of 2)]
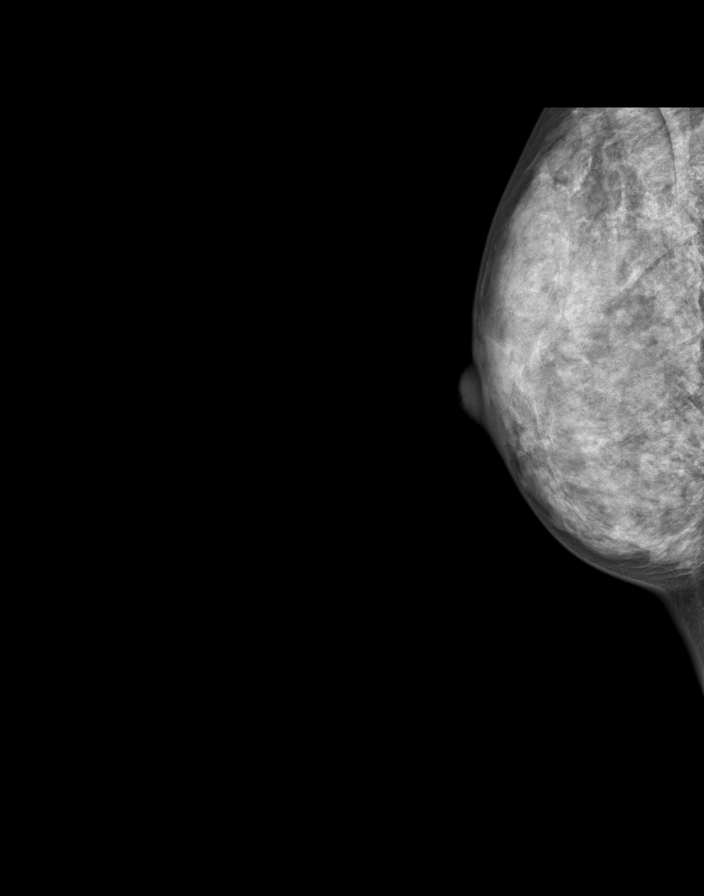

[R tomo (2 of 2)]
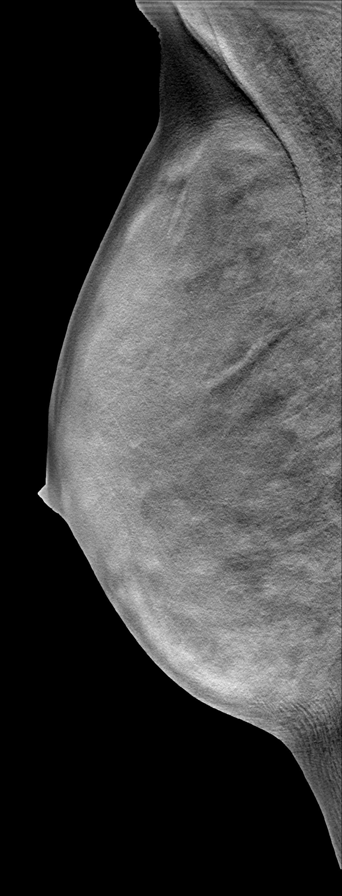

[R MLO (2 of 2)]
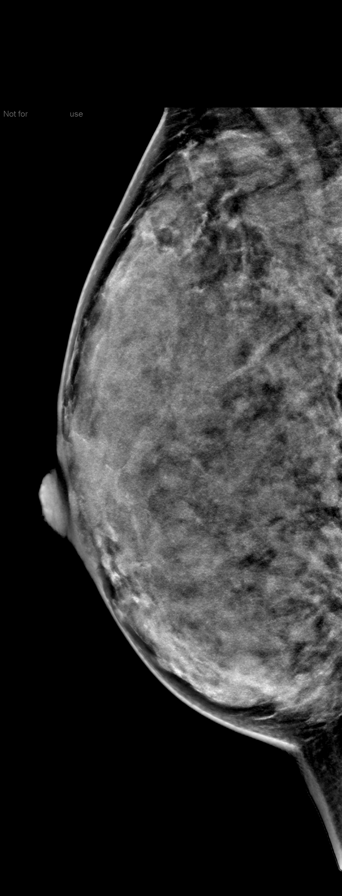

[R (2 of 2)]
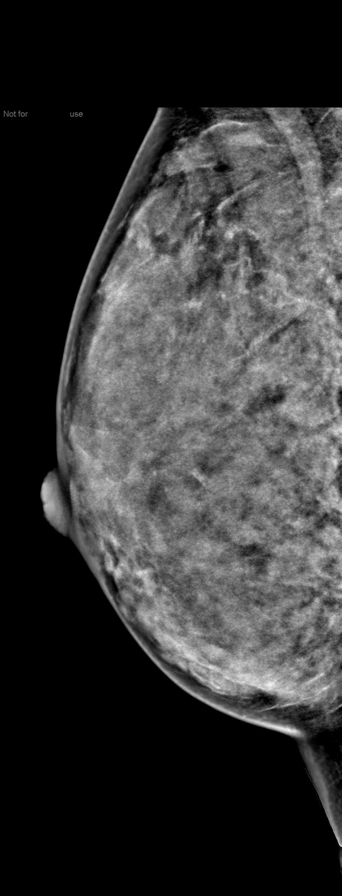

[12 of 17 positions shown; findings below may reference images not displayed]

EXAM
3D SCREENING MAMMOGRAM, BILATERAL

INDICATION
screening
SCR. 3D. DENSE. MOTHER AND PATERNAL AUNT WITH BREAST CANCER. LM

TECHNIQUE
Digital 2D CC and MLO projections obtained with 3D tomographic views per manufacturer's protocol.
ICAD version 7.2 was used during this exam.

COMPARISONS
June 03, 2017

FINDINGS
ACR Type 4: >75% Breast is extremely dense.
No concerning masses calcifications are seen. The breasts are stable and unchanged.

IMPRESSION
Stable bilateral mammography. One year follow-up is recommended.
BI-RADS 2, BENIGN.

Tech Notes:

## 2019-06-06 IMAGING — CT ABDOMEN_PELVIS W(Adult)
2 of 3 series · 12 of 46 positions shown, 14 images · IV contrast (Omnipaque)
Comparison: none

[Series 2: abdomen_pelvis ax 3.00 br40 s3 · axial · 0.44mm/px · z∈[+1196,+1529]mm · 9 of 128 slices shown, 11 images]
[im 9/128  soft-tissue]
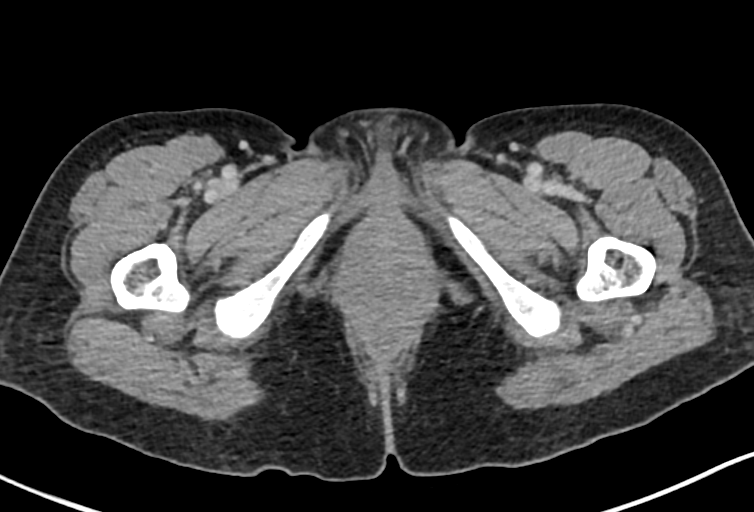
[im 9/128  bone]
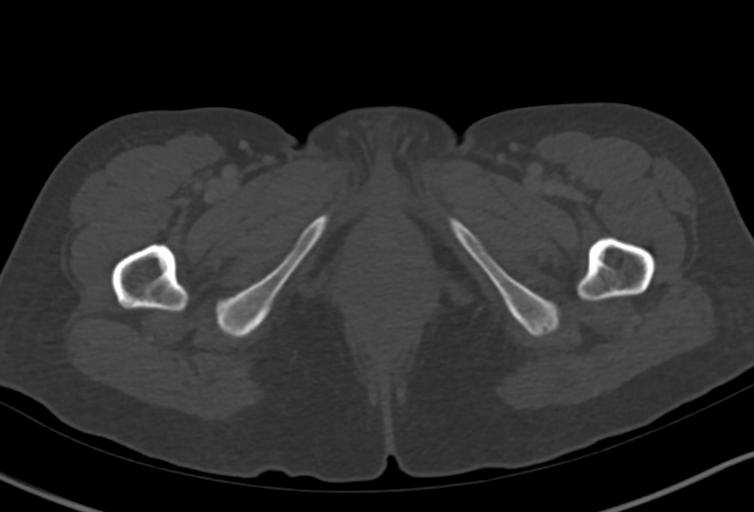
[im 25/128  soft-tissue]
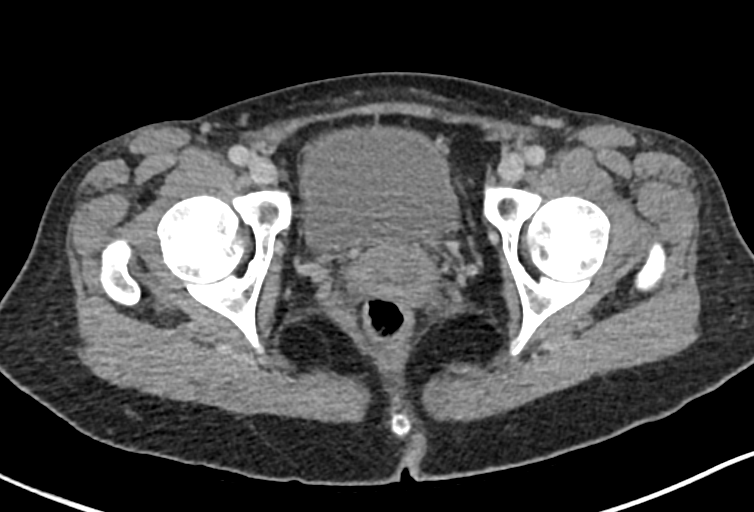
[im 37/128  soft-tissue]
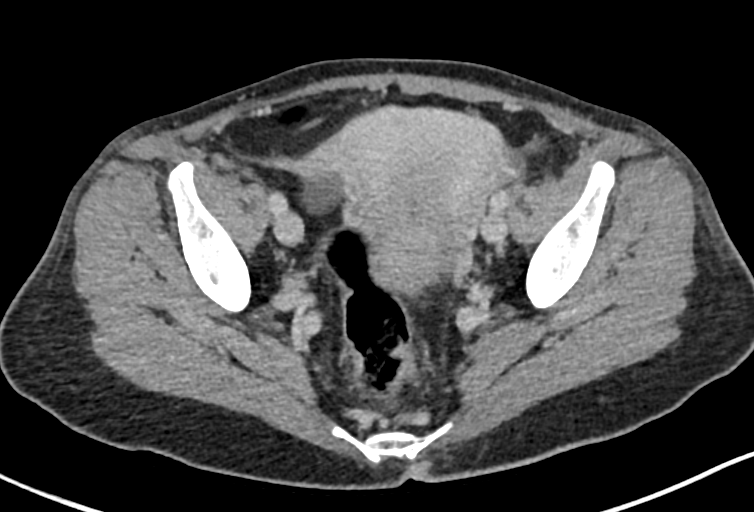
[im 50/128  soft-tissue]
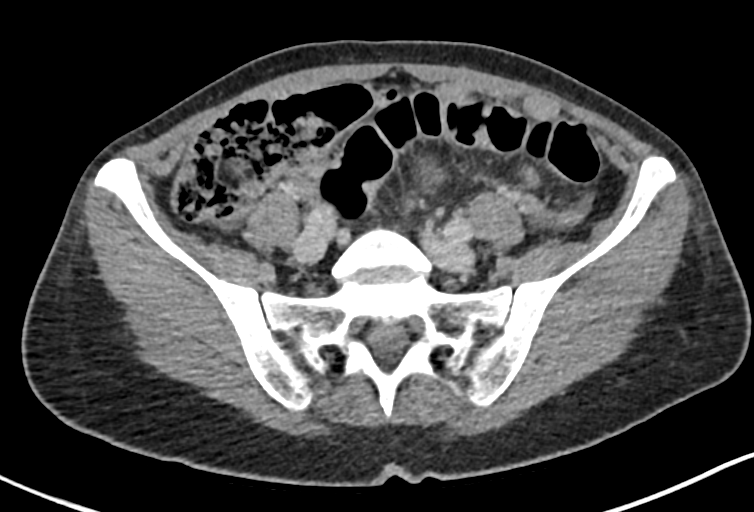
[im 66/128  soft-tissue]
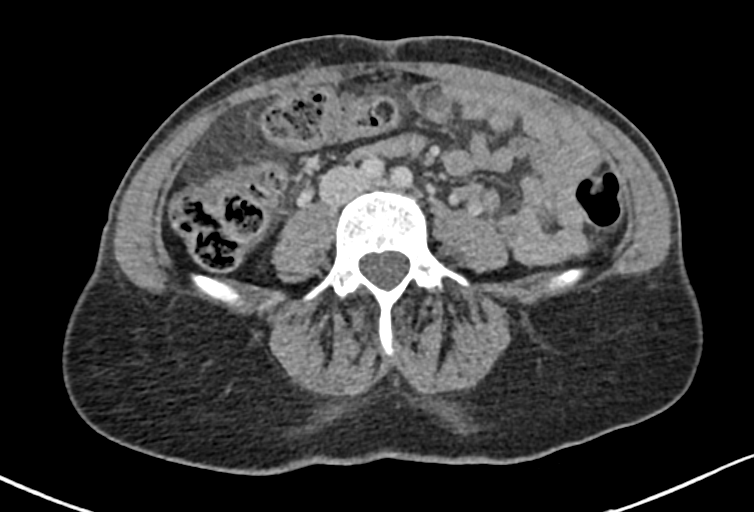
[im 78/128  soft-tissue]
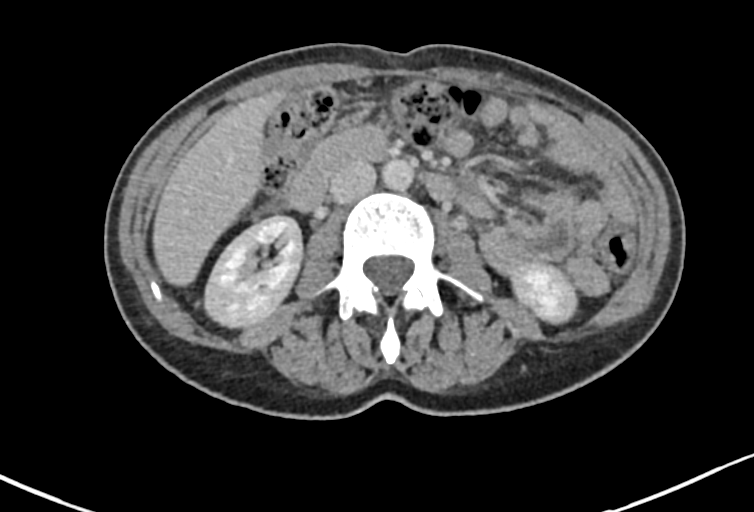
[im 91/128  soft-tissue]
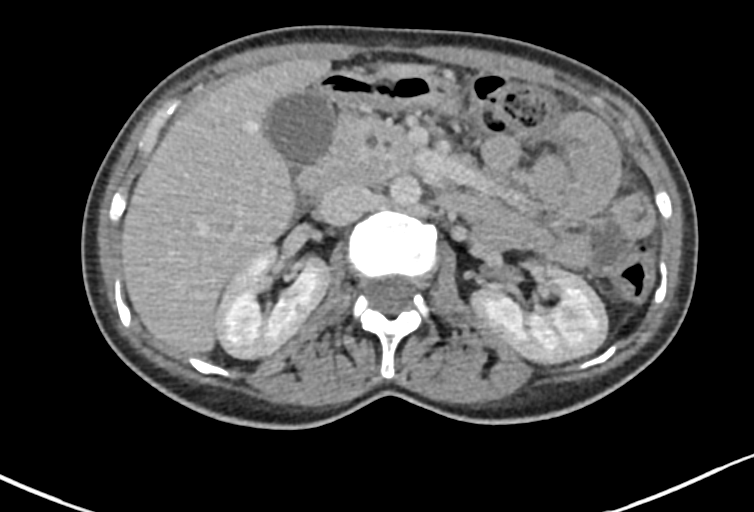
[im 107/128  soft-tissue]
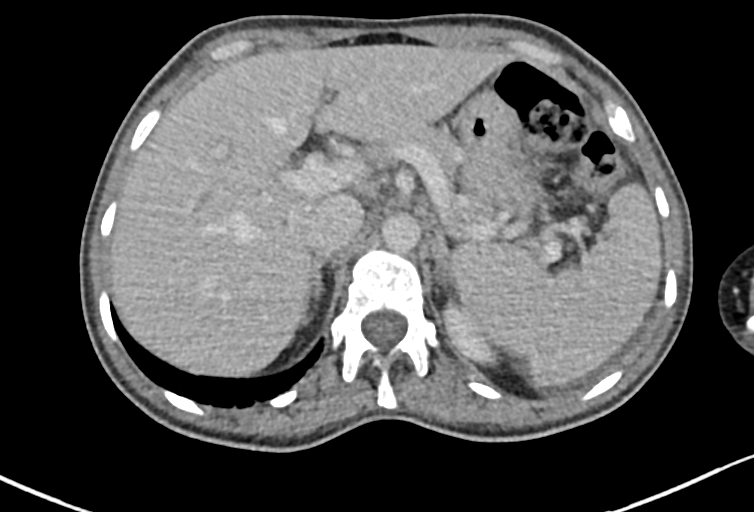
[im 119/128  soft-tissue]
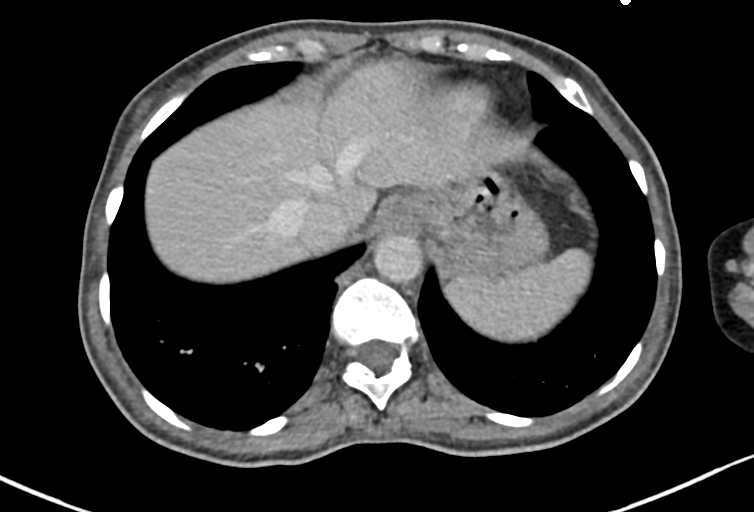
[im 119/128  bone]
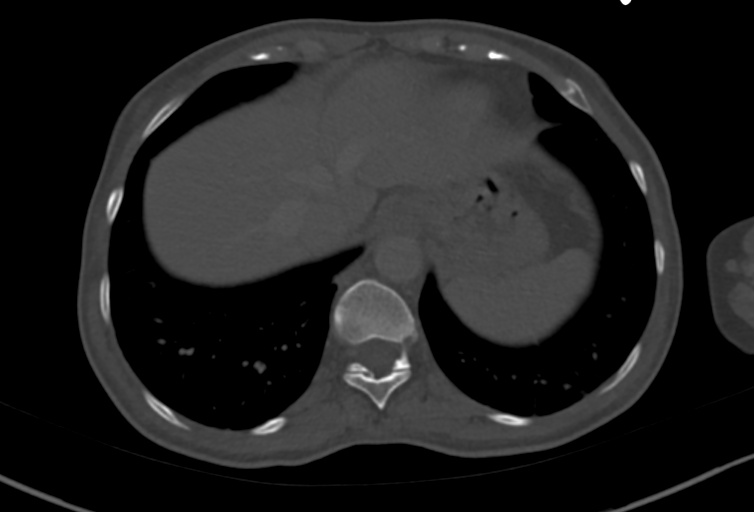

[Series 4: abdomen_pelvis cor 3.00 br40 s3 · coronal · 0.64mm/px · 3 of 73 slices shown]
[im 25/73  soft-tissue]
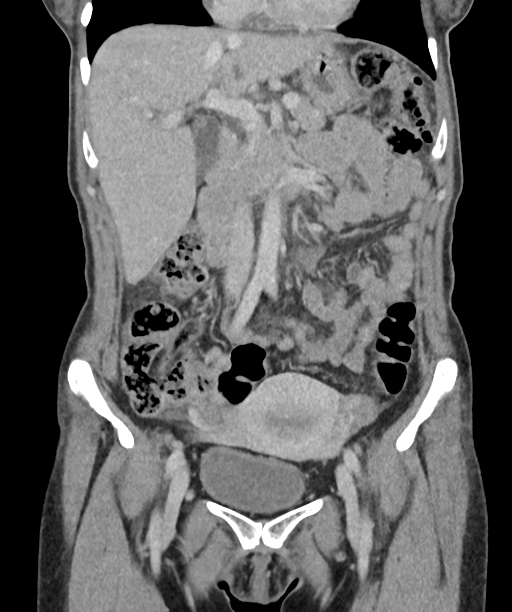
[im 33/73  soft-tissue]
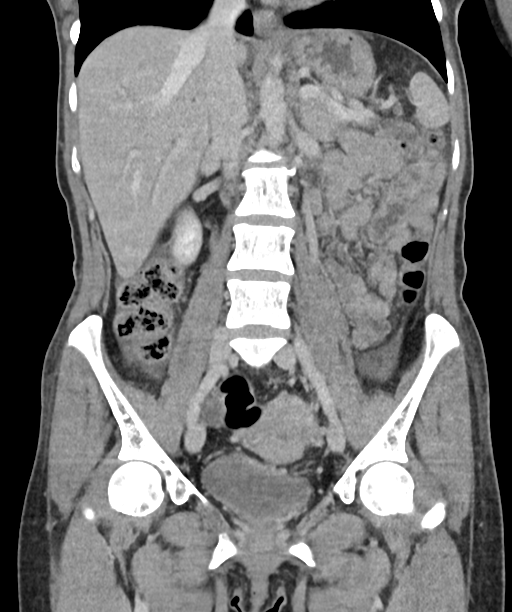
[im 41/73  soft-tissue]
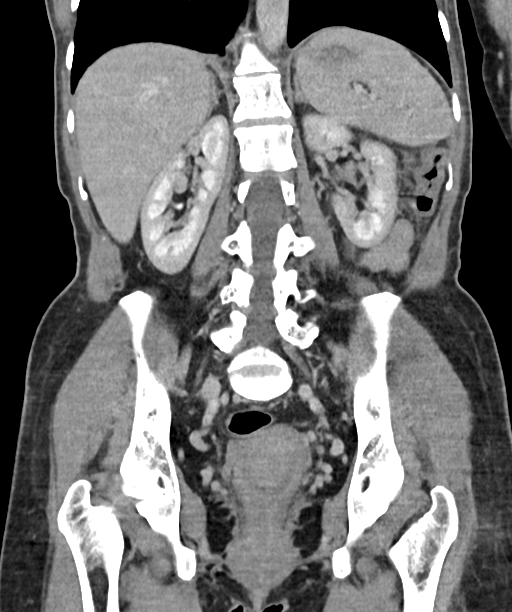

[12 of 46 positions shown; findings below may reference images not displayed]

EXAM

COMPUTED TOMOGRAPHY, ABDOMEN AND PELVIS, WITH CONTRAST MATERIAL CPT 93799

INDICATION

Patient complains of nausea, vomiting and abdominal pain, upper abd pain x 1 day. Patient denied
pregnancy.   CT/NM 0/0  CREAT: .75  GFR: 57.6  01ORZKK 50ML   JC

TECHNIQUE

Multiple contiguous transaxial images were obtained through the abdomen and pelvis following oral
and intravenous contrast material. Sagittal and coronal images were generated and evaluated as well.
The patient was injected with 50 mL of Omnipaque 300.

Number of previous computed tomography exams in the last 12 months is 0 .

Number of previous nuclear medicine myocardial perfusion studies in the last 12 months is 0 .

All CT scans at this facility use dose modulation, iterative reconstruction, and/or weight based
dosing when appropriate to reduce radiation dose to as low as reasonably achievable.

COMPARISONS

Previous examination dated 05/27/2016.

FINDINGS

Lung bases are clear.  There are no pleural or pericardial effusions.

The liver, spleen, pancreas, adrenal glands and kidneys are normal in shape and morphology.  There
is no evidence of intra or extrahepatic biliary ductal dilatation.  The gallbladder appears within
normal limits.

There is no evidence of epigastric, periportal or mesenteric adenopathy. The aorta is normal in
caliber and contour. The inferior vena cava is patent.

There is no evidence of retroperitoneal or pelvic sidewall lymphadenopathy.  Visualized loops of
small and large bowel appear normal in caliber and contour.  The appendix appears unremarkable.

There is free fluid in Morrison's pouch. There is no evidence of free intraperitoneal air.  The
uterus and ovaries appear within normal limits. 2.6 x 2.9 cm right adnexal cyst is noted. The bony
structures are within normal limits as well.

IMPRESSION

Minimal free fluid in Morison's pouch. Visualized loops of small and large bowel are normal in
caliber and contour. The appendix appears unremarkable. There is a 2.6 x 2.9 cm right adnexal cyst.

Tech Notes:

GFR:
01ORZKK 50ML
JC

## 2019-06-06 IMAGING — CR CHEST
1 series · 1 of 1 positions shown · non-contrast
Comparison: none

[chest ap grid]
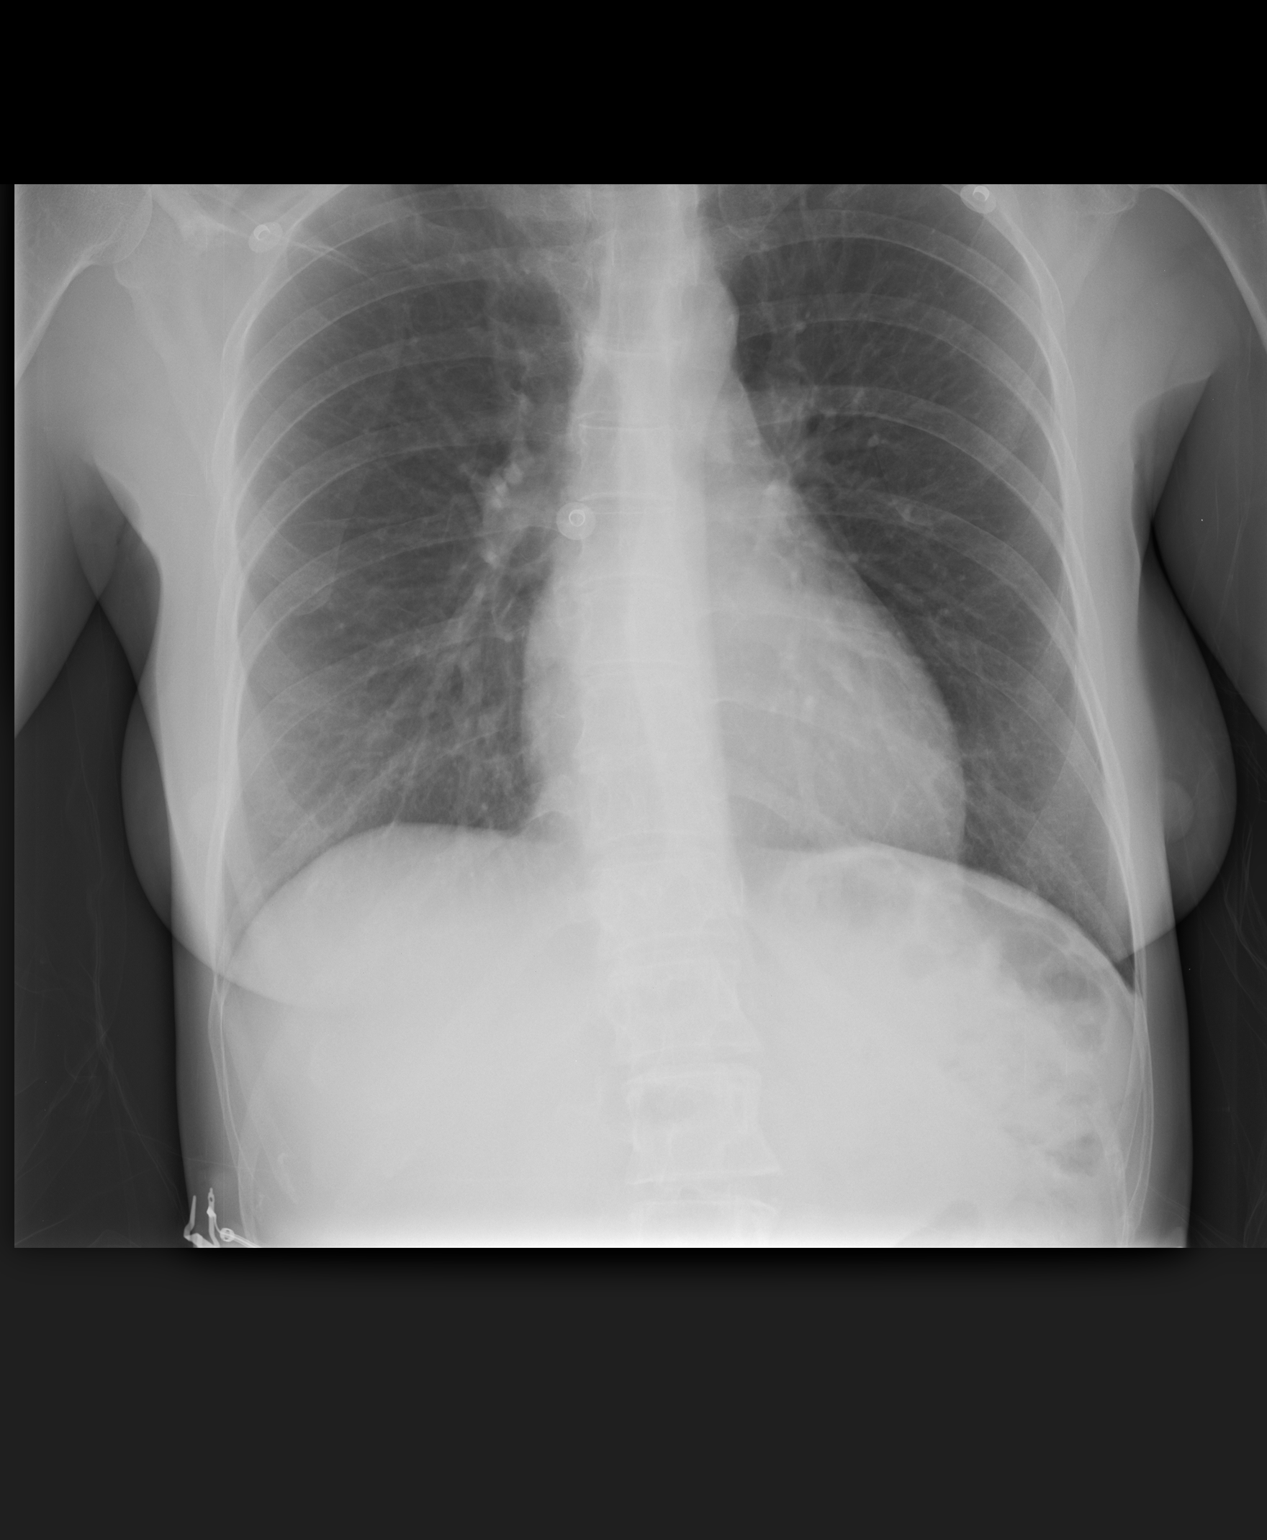

[1 of 1 positions shown; findings below may reference images not displayed]

EXAM

XR chest 1V

INDICATION

Chest pain

TECHNIQUE

Single view of the chest

COMPARISONS

09/07/15

FINDINGS

No radiographically apparent pleural effusion, consolidation, or pneumothorax.

The cardiomediastinal silhouette is  normal in size.

The osseous structures are without an acute osseous abnormality.

IMPRESSION
1. No radiographic evidence of an acute cardiopulmonary process.

Tech Notes:

chest pain
tubal

## 2019-06-07 IMAGING — US ECHOCOMPL
1 series · 14 of 24 positions shown · non-contrast
Comparison: none

[Series 1: us echo 2d, wo/w m-mode, compl · 88 acquisitions, 14 frames shown]
[im 1/88]
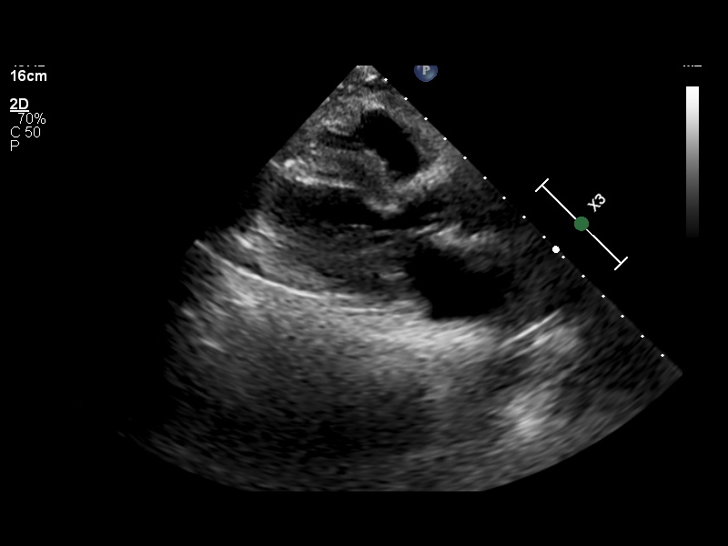
[im 8/88]
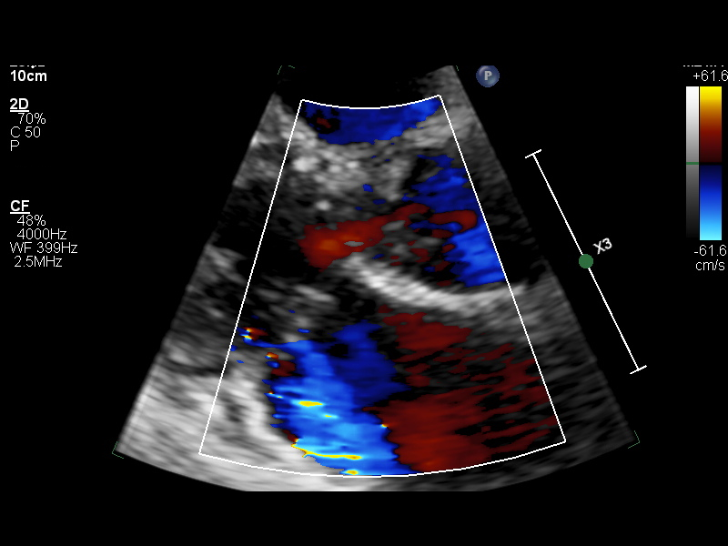
[im 16/88]
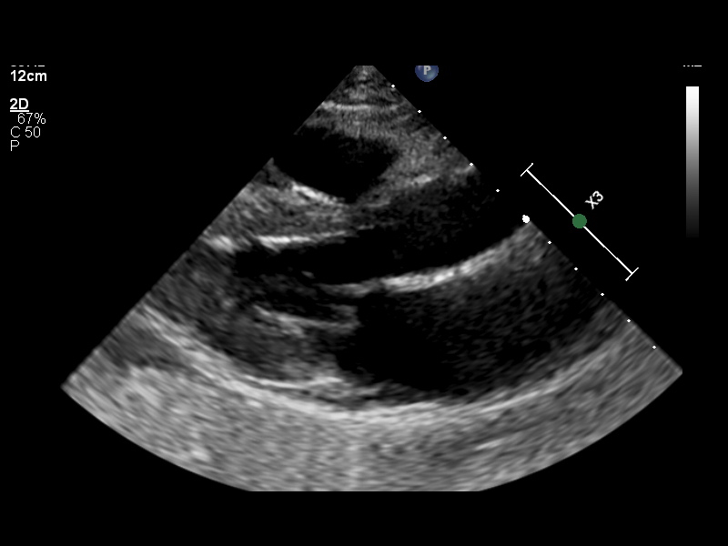
[im 23/88]
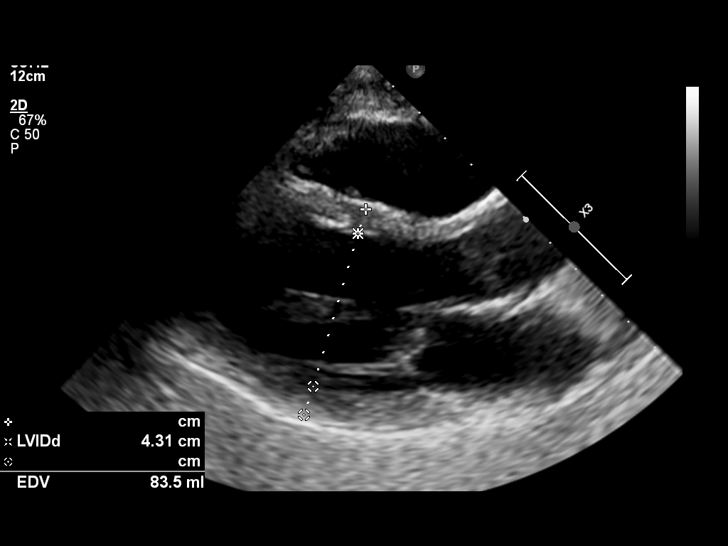
[im 27/88]
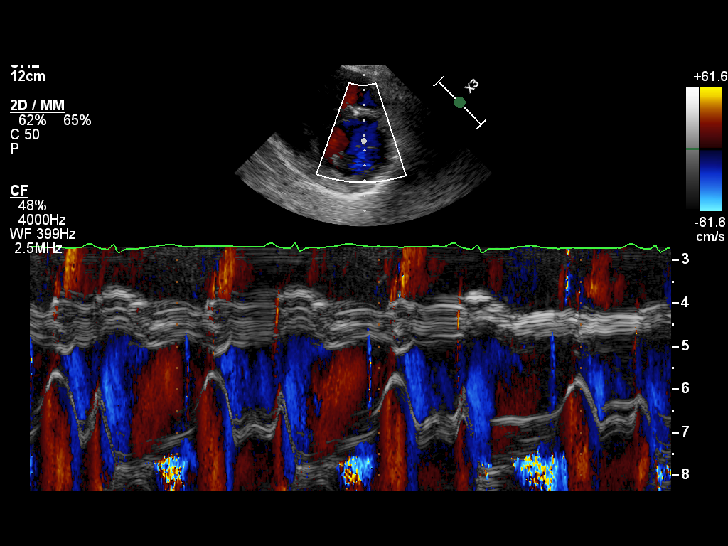
[im 35/88]
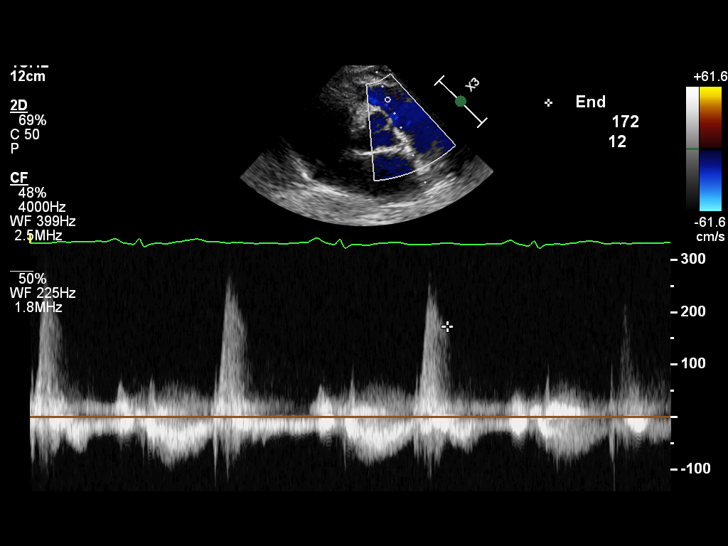
[im 42/88]
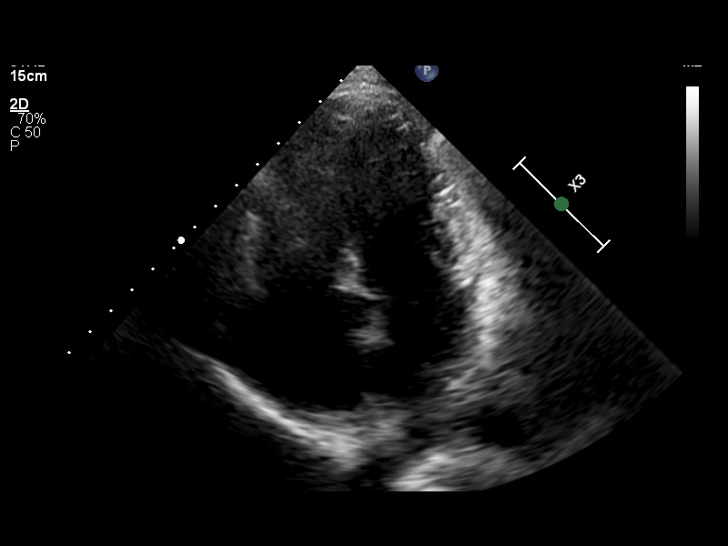
[im 42/88]
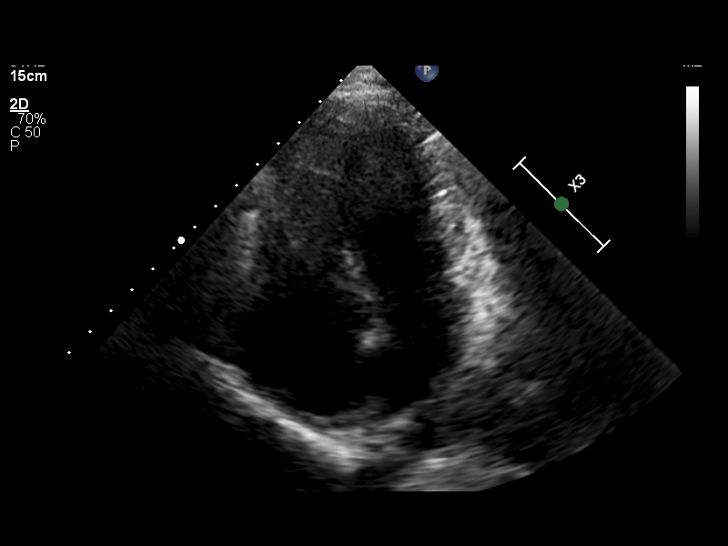
[im 50/88]
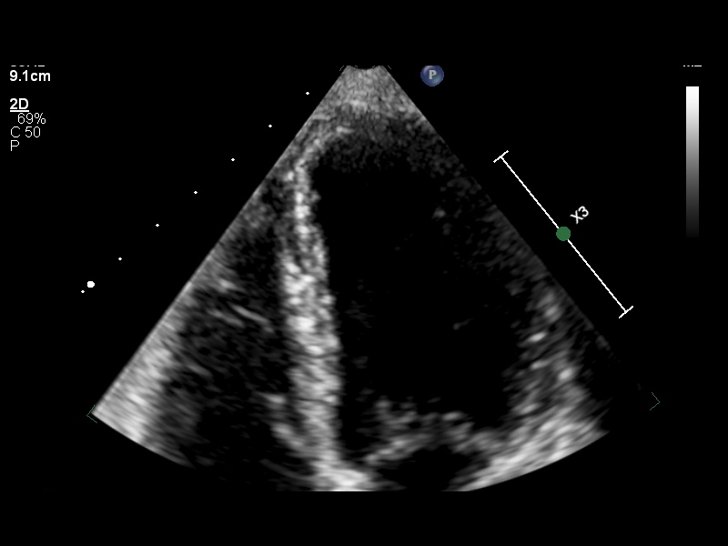
[im 57/88]
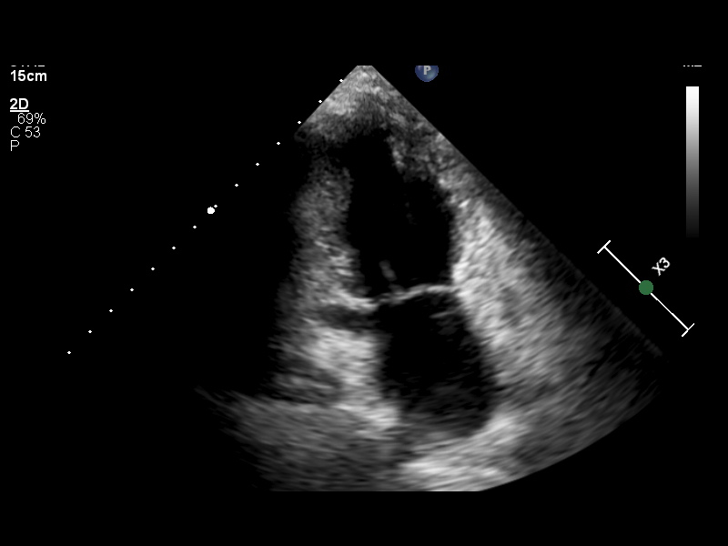
[im 69/88]
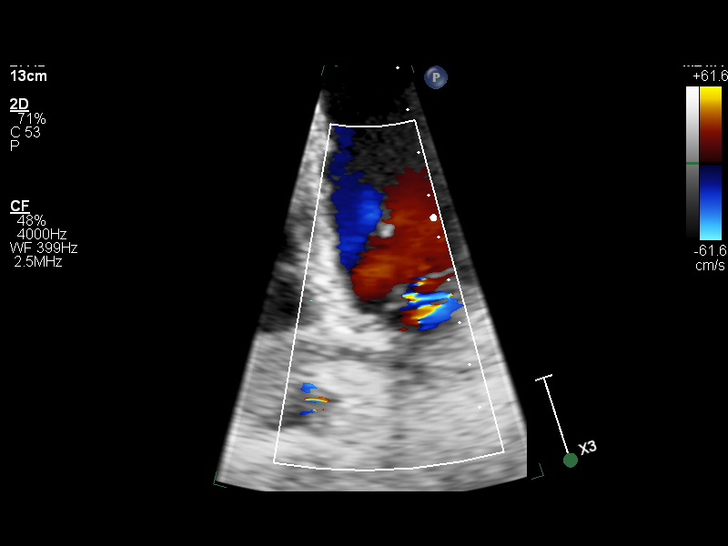
[im 69/88]
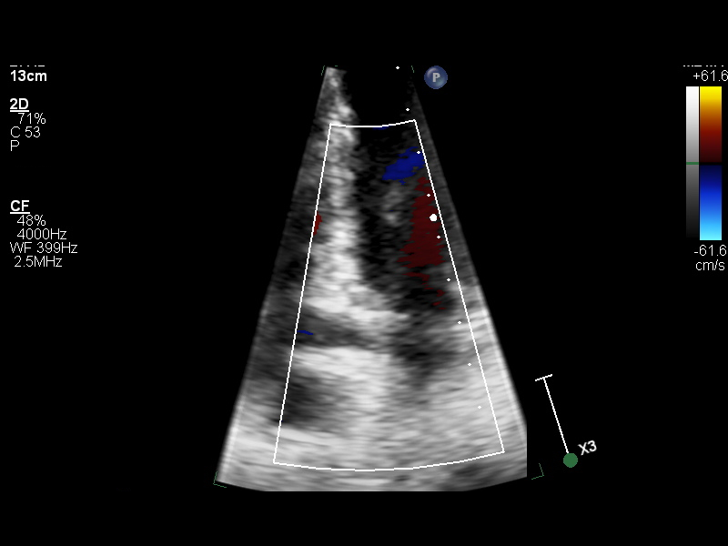
[im 76/88]
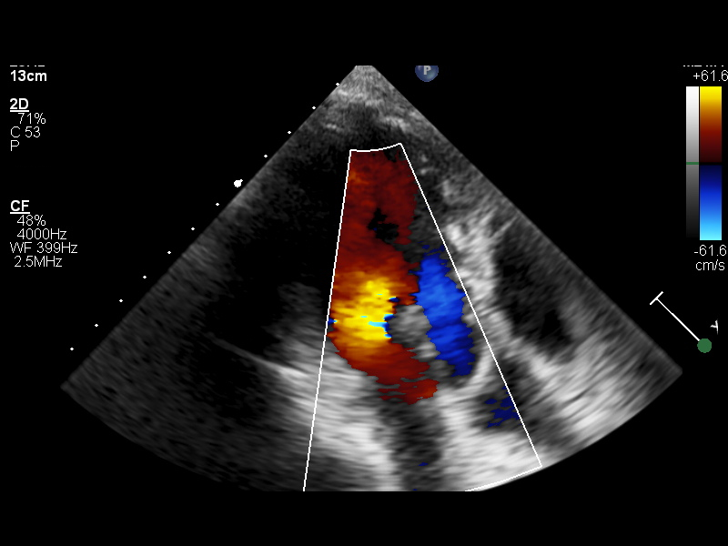
[im 88/88]
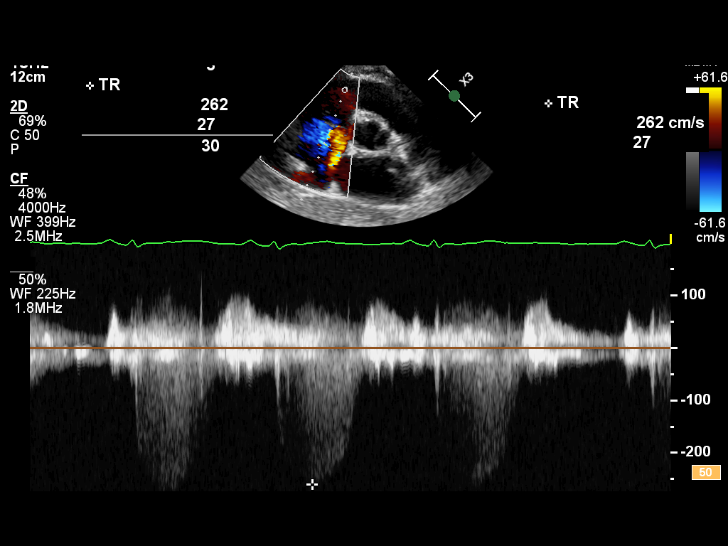

[14 of 24 positions shown; findings below may reference images not displayed]

FINAL REPORT IS SCANNED IN THE PATIENT'S EMR.

Tech Notes:

## 2019-06-17 ENCOUNTER — Encounter: Admit: 2019-06-17 | Discharge: 2019-06-17

## 2019-07-13 ENCOUNTER — Encounter: Admit: 2019-07-13 | Discharge: 2019-07-13 | Payer: MEDICAID

## 2019-07-13 DIAGNOSIS — R079 Chest pain, unspecified: Secondary | ICD-10-CM

## 2019-07-13 DIAGNOSIS — F419 Anxiety disorder, unspecified: Secondary | ICD-10-CM

## 2019-07-13 DIAGNOSIS — I499 Cardiac arrhythmia, unspecified: Secondary | ICD-10-CM

## 2019-07-13 NOTE — Patient Instructions
Thank you for visiting our office today.    Continue the same medications as you have been doing.          We will be pursuing the following tests after your appointment today:       Orders Placed This Encounter   ? ECG Today (all locations)   ? HOLTER WEARABLE ECG MONITOR CONNECT + SCAN   ? [facility] MPI STRESS TEST     Return Thursday or next Tuesday to have holter monitor set up.    We will plan to see you back in 3 months.  Please call us in the meantime with any questions or concerns.        Please allow 5-7 business days for our providers to review your results. All normal results will go to MyChart. If you do not have Mychart, it is strongly recommended to get this so you can easily view all your results. If you do not have mychart, we will attempt to call you once with normal lab and testing results. If we cannot reach you by phone with normal results, we will send you a letter.  If you have not heard the results of your testing after one week please give [place] a call.       Your [facility] [name], Pilar Jarvis and [place])  phone number is [phone].                                     [facility] Nuclear Stress Test Instructions    PLEASE REPORT TO:    ____[facility] ([address], [place], [place]) - [phone]   ____[facility] ([address], [address], [place], [place]) - [phone]    ____[facility] ([address] [place], [place]) - [phone]    ____[facility] ([address], [address], [place], [place]) - [phone]   ____[facility] ([address], [place], [place]) - [phone]     [facility] Main Phone Number: [phone]     Date of Test  ____________  at ____________  for ________________________    Are you able to raise your arm up by your head for about 20 minutes? yes  Can you lie on your back for approximately 20 minutes with minimal movement? yes    The Thallium evaluation has two parts -- two nuclear scans. The first scan is done in the morning and the second three to four hours later.   Wear comfortable clothing. Shorts or pants. (No dresses or skirts please).  Bring or wear sneakers/walking shoes if you are walking on [place].  Please let the nuclear technologists know if you plan on flying after the test.    NO CAFFEINE 24 HOURS PRIOR TO TEST. Examples: coffee, tea, decaf drinks, cola, chocolate.     DO NOT EAT OR DRINK THE MORNING OF YOUR TEST unless otherwise instructed. (You may have a couple sips of water).    If you are a diabetic, if insulin dependent: please take one third of your insulin with two pieces of dry toast and a small juice). Bring insulin and medication with you to the test.     ___ TAKE MORNING MEDICATIONS WITH A COUPLE SIPS OF WATER PRIOR TO TEST.       WHAT TO DO BETWEEN THE FIRST TWO THALLIUM SCANS:  1. No strenuous exercise  should be performed during this time.  2. A light lunch is permissible. The technologist will give you a list of appropriate foods.  3. Please return 15 minutes prior to the schedule of your second scan. Our nuclear technologist will tell you exactly what time to return.  4. Please do not use tobacco products in between scans.  5. After the first scan is completed, you may resume usual medications.     TEST FINDINGS:  You will receive the results of the test within 7 business days of its completion by telephone, unless arranged differently at the time of the procedure.  If you have any questions concerning your thallium test or if you do not hear from your [facility] physician/or nurse within 7 business days, please call the appropriate office checked above.      Instructions given by Fulton Reek, RN

## 2019-07-15 ENCOUNTER — Encounter: Admit: 2019-07-15 | Discharge: 2019-07-15 | Payer: MEDICAID

## 2019-07-15 ENCOUNTER — Ambulatory Visit: Admit: 2019-07-15 | Discharge: 2019-07-15 | Payer: MEDICAID

## 2019-07-15 DIAGNOSIS — I499 Cardiac arrhythmia, unspecified: Secondary | ICD-10-CM

## 2019-07-15 DIAGNOSIS — R079 Chest pain, unspecified: Secondary | ICD-10-CM

## 2019-07-15 NOTE — Progress Notes
Online enrollment confirmed with [employer] (cardio net)

## 2019-07-15 NOTE — Progress Notes
Holter Placement Record  Ordering Physician: Dr. Arlester Marker Love  Diagnosis: Irregular Heartbeat; Chest pain  Brand:Cardionet  Length: 48 hours  Holter Number: [id number]  Holter start: 1:23PM  Location where Holter was placed: [facility]  Will Holter be returned by mail? Yes

## 2019-08-25 ENCOUNTER — Encounter: Admit: 2019-08-25 | Discharge: 2019-08-25 | Payer: MEDICAID

## 2019-08-25 NOTE — Telephone Encounter
-----   Message from [name], MD sent at 08/25/2019  9:21 AM CST -----  Would you guys mind reaching out to [facility] and letting her know that her Holter monitor demonstrated extra heartbeats coming from the lower chambers.  As long as she is asymptomatic I do not think there is anything we need to do as far as treatment.  We are awaiting a stress myocardial perfusion study which will help [place] determine the etiology.    Thanks!

## 2019-08-25 NOTE — Telephone Encounter
Called and discussed results with patient.  No questions at this time.  Pt will callback with any questions, concerns or problems.

## 2019-10-14 ENCOUNTER — Encounter: Admit: 2019-10-14 | Discharge: 2019-10-14 | Payer: MEDICAID

## 2019-10-14 DIAGNOSIS — R079 Chest pain, unspecified: Secondary | ICD-10-CM

## 2019-10-14 DIAGNOSIS — I499 Cardiac arrhythmia, unspecified: Secondary | ICD-10-CM

## 2019-10-14 DIAGNOSIS — F419 Anxiety disorder, unspecified: Secondary | ICD-10-CM

## 2019-10-14 MED ORDER — METOPROLOL TARTRATE 25 MG PO TAB
12.5 mg | ORAL_TABLET | Freq: Two times a day (BID) | ORAL | 11 refills | 90.00000 days | Status: DC
Start: 2019-10-14 — End: 2019-10-20

## 2019-10-14 NOTE — Patient Instructions
Thank you for visiting our office today.    We would like to make the following medication adjustments:       Begin taking Metoprolol 12.5 mg twice a day.   Stop taking if this makes you feel really tired or takes your heart rate too low.   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    Otherwise continue the same medications as you have been doing.          We will be pursuing the following tests after your appointment today:       Orders Placed This Encounter   ? metoprolol tartrate (LOPRESSOR) 25 mg tablet     [facility] will contact you to schedule your stress test.     We will plan to see you back in 2 months.  Please call us in the meantime with any questions or concerns.        Please allow 5-7 business days for our providers to review your results. All normal results will go to MyChart. If you do not have Mychart, it is strongly recommended to get this so you can easily view all your results. If you do not have mychart, we will attempt to call you once with normal lab and testing results. If we cannot reach you by phone with normal results, we will send you a letter.  If you have not heard the results of your testing after one week please give [place] a call.       Your [facility] [place] [name], Pilar Jarvis and [place])  phone number is [phone].             [facility] Nuclear Stress Test Instructions    PLEASE REPORT TO:    ____[facility] ([address], [address], [place], [place]) - [phone]   ____[facility] ([address], [address], [place], [place]) - [phone]    ____[facility] ([address] [place], [place]) - [phone]    ____[facility] ([address], [place], [place]) - [phone]   ____[facility] ([address] [place], [place]) - [phone]     [facility] Main Phone Number: [phone]     Date of Test  ____________  at ____________  for ________________________    Are you able to raise your arm up by your head for about 20 minutes? yes  Can you lie on your back for approximately 20 minutes with minimal movement? yes    The Thallium evaluation has two parts -- two nuclear scans.  The first scan is done in the morning and the second three to four hours later.   Wear comfortable clothing. Shorts or pants. (No dresses or skirts please).  Bring or wear sneakers/walking shoes if you are walking on T[place]mill.  Please let the nuclear technologists know if you plan on flying after the test.    NO CAFFEINE 24 HOURS PRIOR TO TEST. Examples: coffee, tea, decaf drinks, cola, chocolate.     DO NOT EAT OR DRINK THE MORNING OF YOUR TEST unless otherwise instructed. (You may have a couple sips of water).    If you are a diabetic, if insulin dependent: please take one third of your insulin with two pieces of dry toast and a small juice). Bring insulin and medication with you to the test.     ___ TAKE MORNING MEDICATIONS WITH A COUPLE SIPS OF WATER PRIOR TO TEST.  HOLD THE FOLLOWING MEDICATIONS AS INDICATED BELOW:      ?        WHAT TO DO BETWEEN THE FIRST TWO THALLIUM SCANS:  1. No strenuous exercise should be performed during this time.  2. A light lunch is permissible. The technologist will give you a list of appropriate foods.  3. Please return 15 minutes prior to the schedule of your second scan. Our nuclear technologist will tell you exactly what time to return.  4. Please do not use tobacco products in between scans.  5. After the first scan is completed, you may resume usual medications.     TEST FINDINGS:  You will receive the results of the test within 7 business days of its completion by telephone, unless arranged differently at the time of the procedure.  If you have any questions concerning your thallium test or if you do not hear from your CVM physician/or nurse within 7 business days, please call the appropriate office checked above.      Instructions given by Fulton Reek, RN

## 2019-10-15 ENCOUNTER — Encounter: Admit: 2019-10-15 | Discharge: 2019-10-15 | Payer: MEDICAID

## 2019-10-15 NOTE — Progress Notes
Patient was seen in the office in December of 2020 by Dr. Sandria Manly a stress test was ordered for the patient at that time which she has not had done. Patient seen on 10/14/19 by Dr. Sandria Manly and states she will have it done. Order faxed to [facility]. No PA required.

## 2019-10-19 ENCOUNTER — Encounter: Admit: 2019-10-19 | Discharge: 2019-10-19 | Payer: MEDICAID

## 2019-10-19 NOTE — Telephone Encounter
Patient called c/o fatigue with metoprolol.  She states palpations are better, but medication makes her fall asleep in her chair.  Routed to [facility] for review and recommendations.

## 2019-10-20 ENCOUNTER — Encounter: Admit: 2019-10-20 | Discharge: 2019-10-20 | Payer: MEDICAID

## 2019-10-20 MED ORDER — METOPROLOL SUCCINATE 25 MG PO TB24
12.5 mg | ORAL_TABLET | Freq: Every evening | ORAL | 3 refills | 90.00000 days | Status: AC
Start: 2019-10-20 — End: ?

## 2019-10-20 NOTE — Telephone Encounter
Results and recommendations called to patient lmom requested call back if questions

## 2019-10-20 NOTE — Telephone Encounter
-----   Message from [name], MD sent at 10/19/2019  4:41 PM CST -----  Regarding: RE: fatigue  Can try Toprol 12.5 mg at night.     Thanks [name]!  ----- Message -----  From: [name]  Sent: 10/19/2019  11:35 AM CST  To: [name], MD  Subject: fatigue                                          Patient called c/o fatigue with metoprolol.  She states palpations are better, but medication makes her fall asleep in her chair.  Routed to [facility] for review and recommendations.  Would you like her to stop or change to 12.5 of toprol at bedtime?      Thanks  [name]

## 2019-10-21 ENCOUNTER — Encounter: Admit: 2019-10-21 | Discharge: 2019-10-21 | Payer: MEDICAID

## 2019-10-21 NOTE — Telephone Encounter
Pt request stating sent to [facility] for recommendations.

## 2019-10-22 ENCOUNTER — Encounter: Admit: 2019-10-22 | Discharge: 2019-10-22 | Payer: MEDICAID

## 2019-10-22 MED ORDER — ROSUVASTATIN 10 MG PO TAB
10 mg | ORAL_TABLET | Freq: Every day | ORAL | 3 refills | 90.00000 days | Status: AC
Start: 2019-10-22 — End: ?

## 2019-10-22 NOTE — Telephone Encounter
-----   Message from [name], MD sent at 10/22/2019  7:16 AM CST -----  Regarding: RE: request chol med  Rosuvastatin 10 mg qhs. Repeat lipid panel in 3 months.     Thanks [name]!  ----- Message -----  From: [name]  Sent: 10/21/2019   5:45 PM CST  To: [name], MD  Subject: request chol med                                 Pt is requesting to start on statin.  What would you perfer?    Thanks  [name]

## 2019-11-15 IMAGING — CR CHEST
3 series · 3 of 3 positions shown · non-contrast
Comparison: none

[shoulder external]
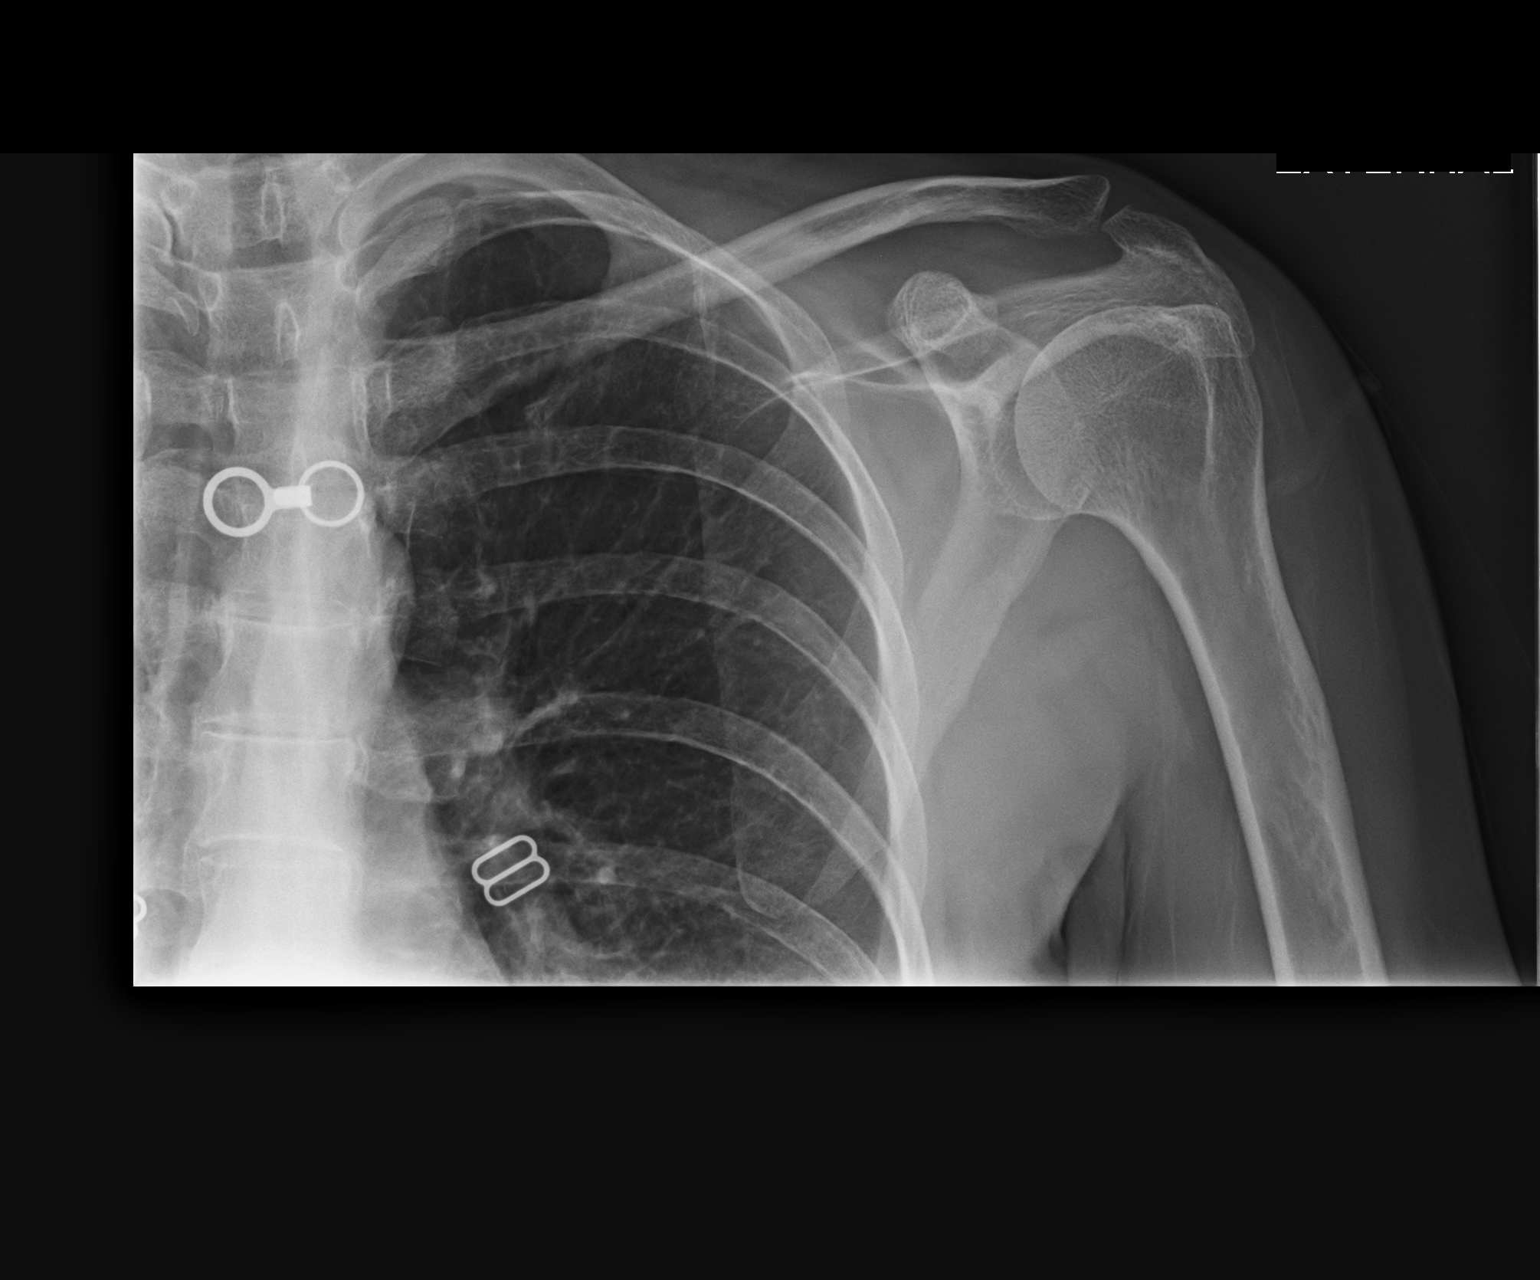

[shoulder internal]
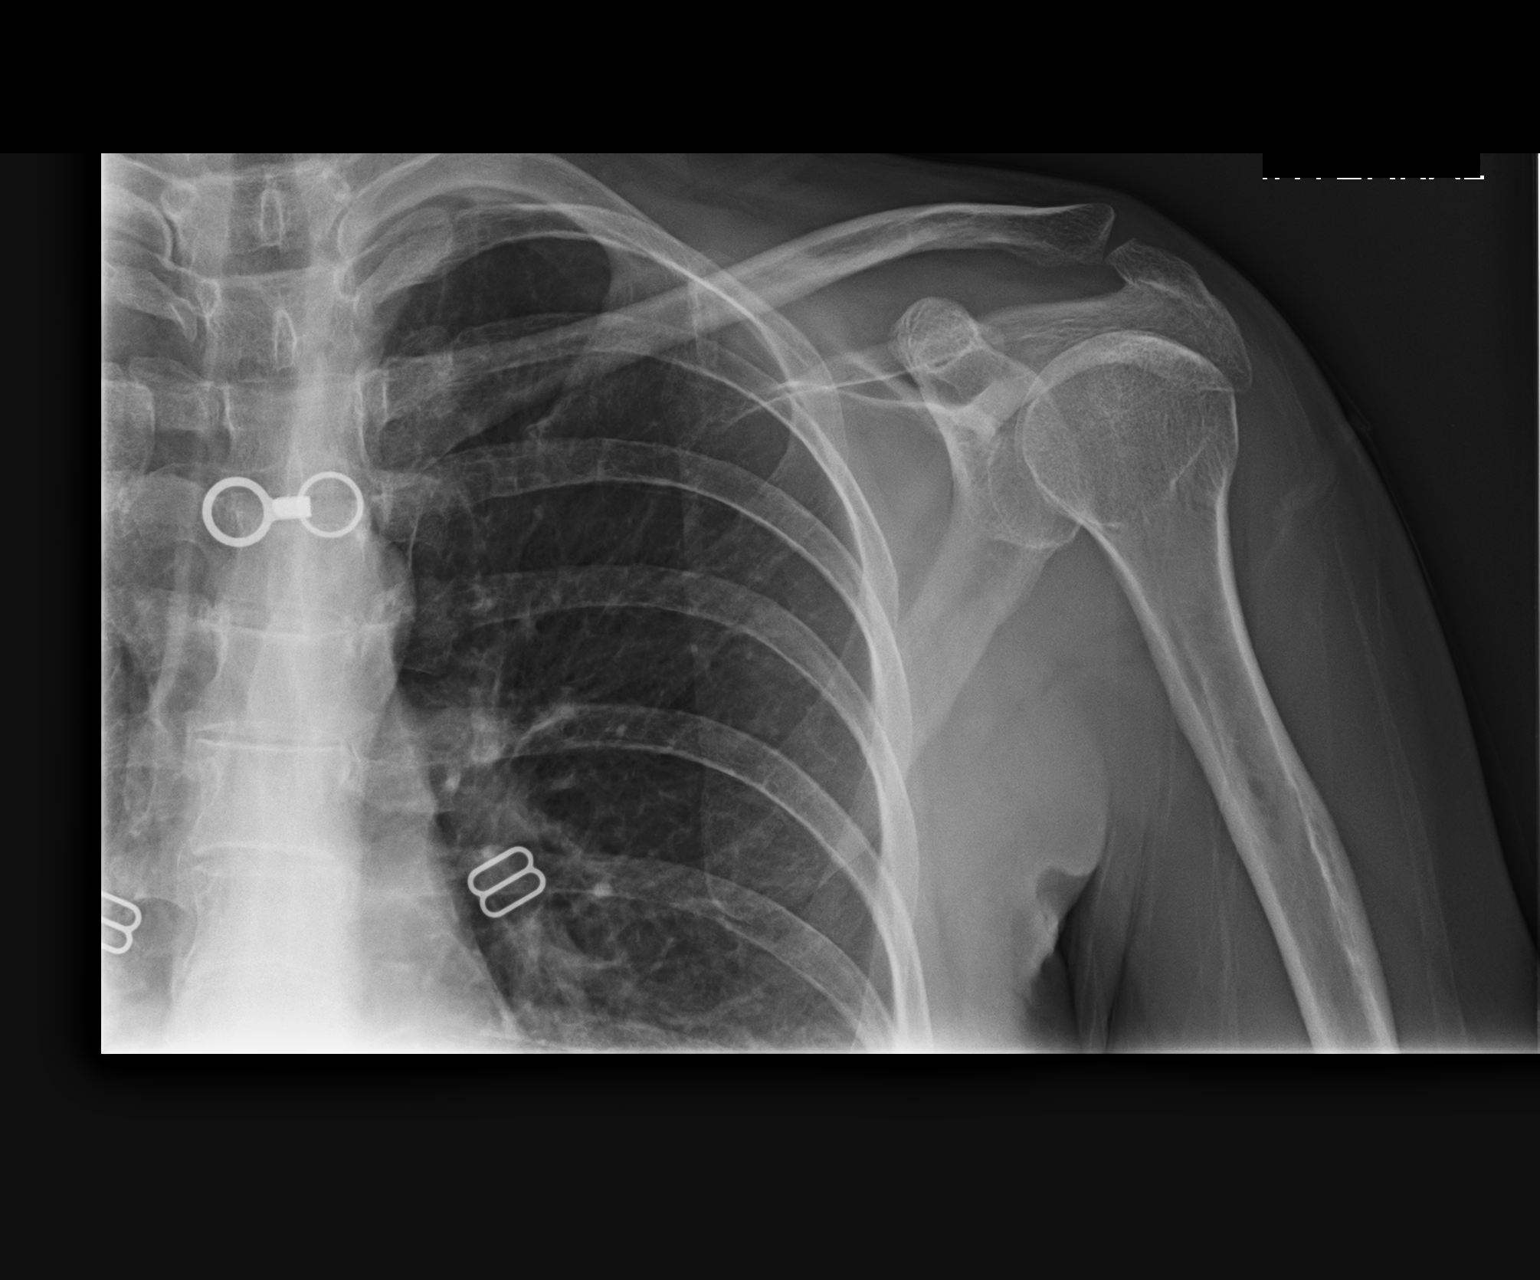

[shoulder y-view]
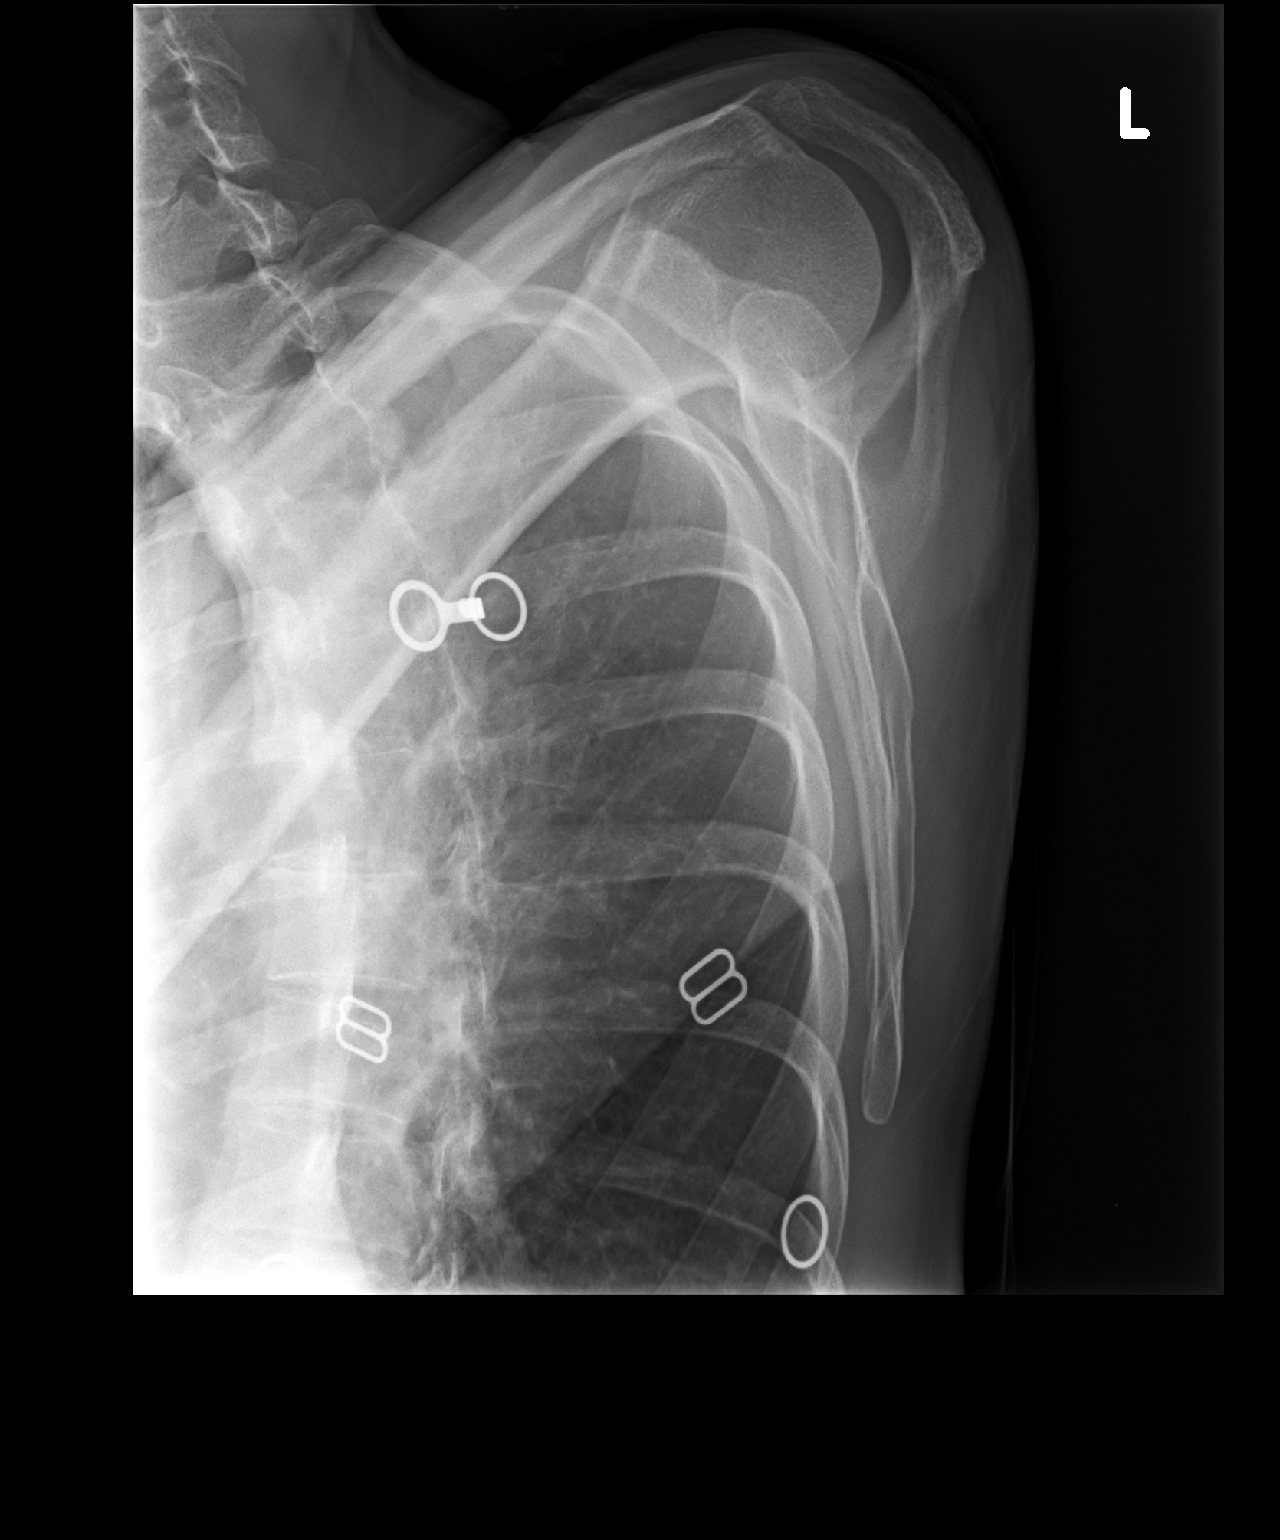

[3 of 3 positions shown; findings below may reference images not displayed]

DIAGNOSTIC STUDIES

EXAM

XR shoulder left, complete

INDICATION

chronic pain
chronic left shoulder pain

TECHNIQUE

Internal and external rotated views and lateral views were obtained.

COMPARISONS

None available

FINDINGS

No fractures or dislocations are seen. Joint spaces are well maintained.

IMPRESSION

No fractures or dislocations of the left shoulder.

Tech Notes:

chronic left shoulder pain

## 2019-11-22 IMAGING — MR Shoulder^ROUTINE
4 of 7 series · 22 of 40 positions shown · non-contrast
Comparison: none

[Series 5: T1 · oblique · 4.0mm · 0.44mm/px · 6 of 21 slices shown]
[im 1/21]
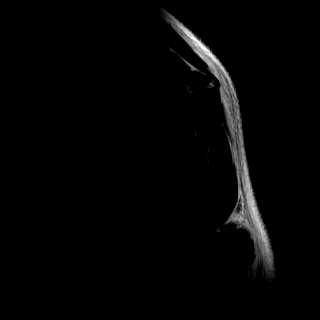
[im 5/21]
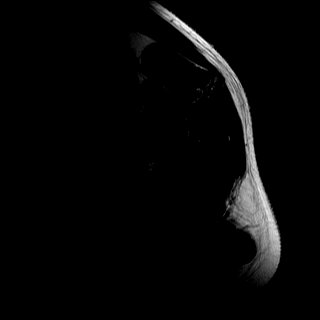
[im 9/21]
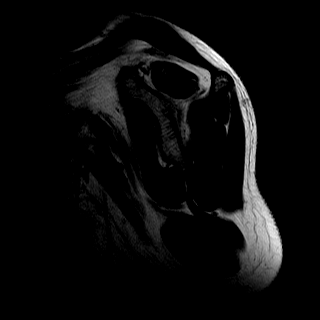
[im 13/21]
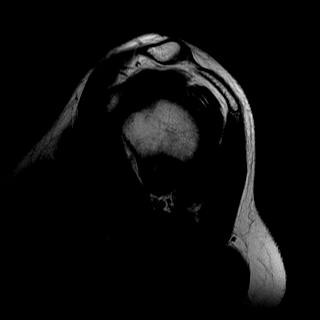
[im 17/21]
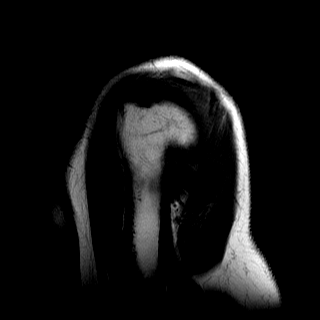
[im 21/21]
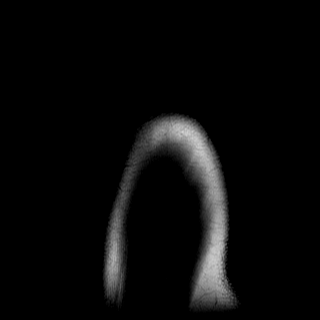

[Series 6: STIR · oblique · 4.0mm · 0.27mm/px · 4 of 22 slices shown]
[im 1/22]
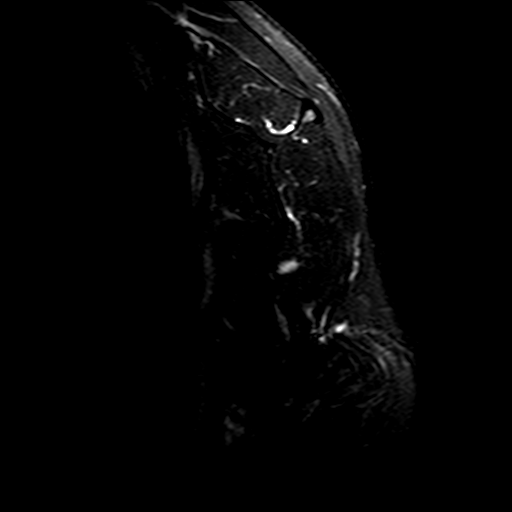
[im 5/22]
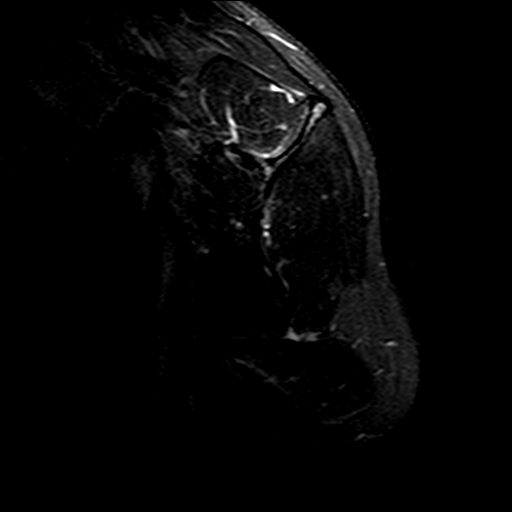
[im 13/22]
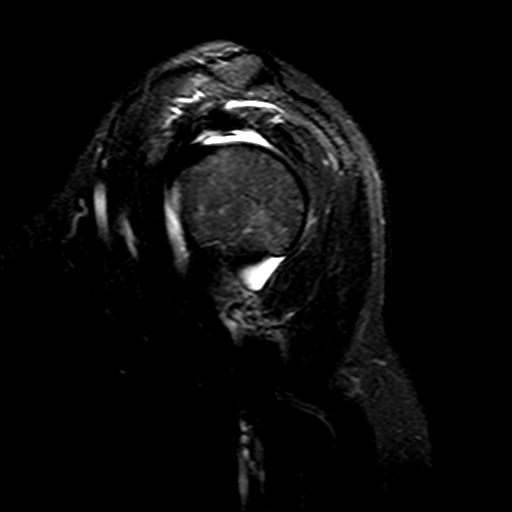
[im 22/22]
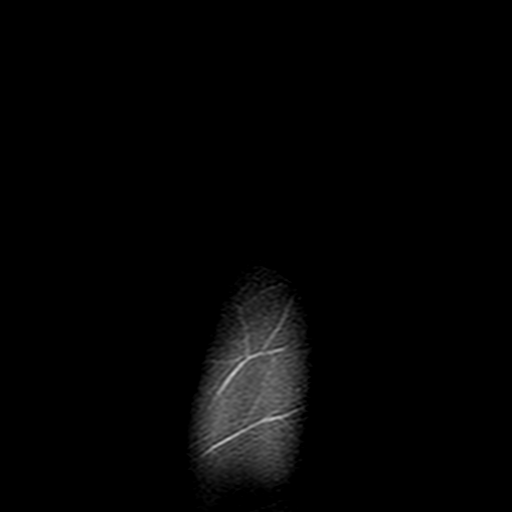

[Series 9: T2 fat-sat · axial · 4.0mm · 0.35mm/px · z∈[-37,+69]mm · 7 of 23 slices shown (1 of 2)]
[im 1/23]
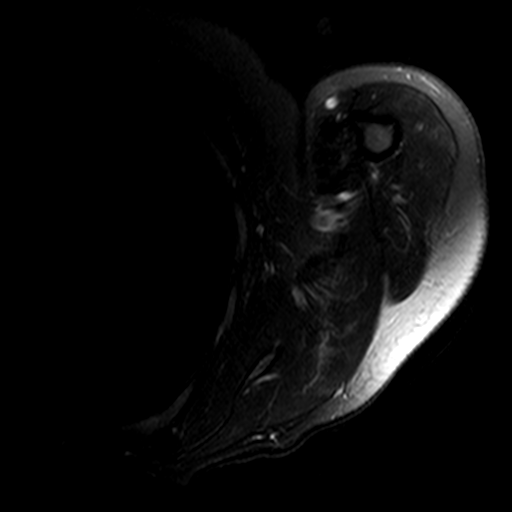
[im 4/23]
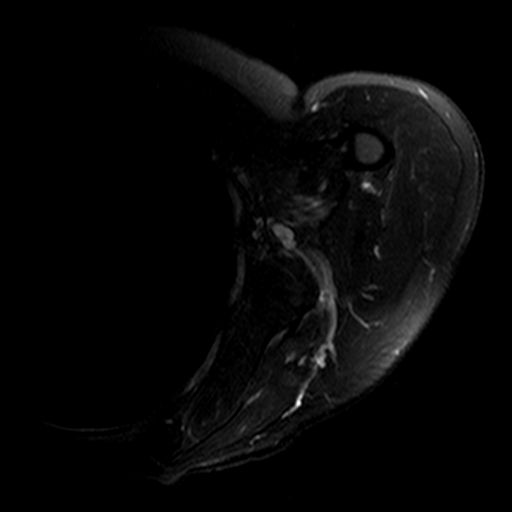
[im 8/23]
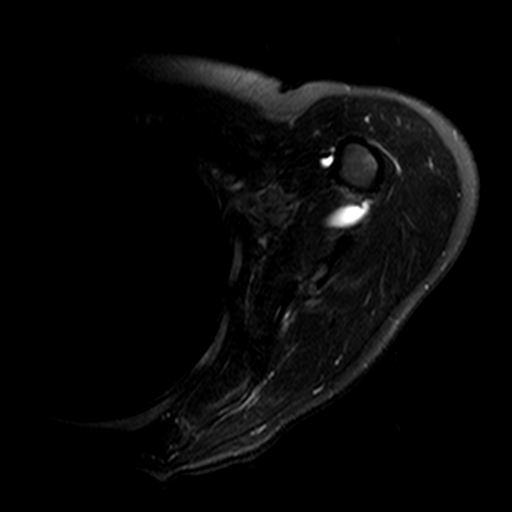
[im 12/23]
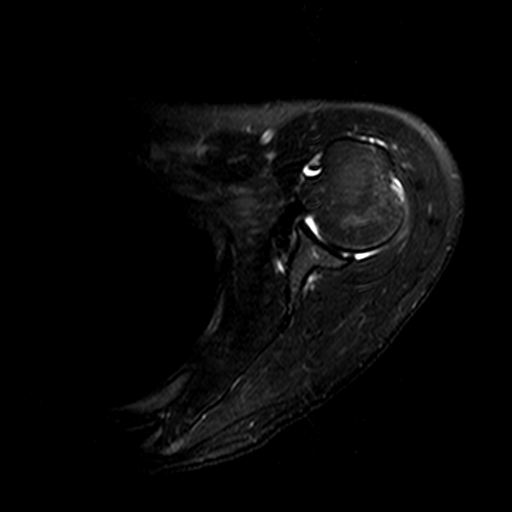
[im 15/23]
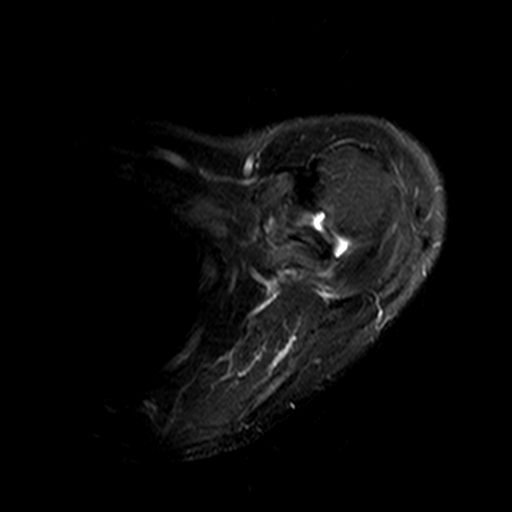
[im 19/23]
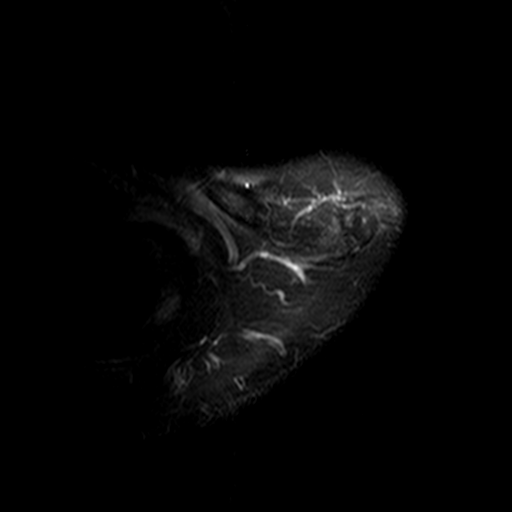
[im 23/23]
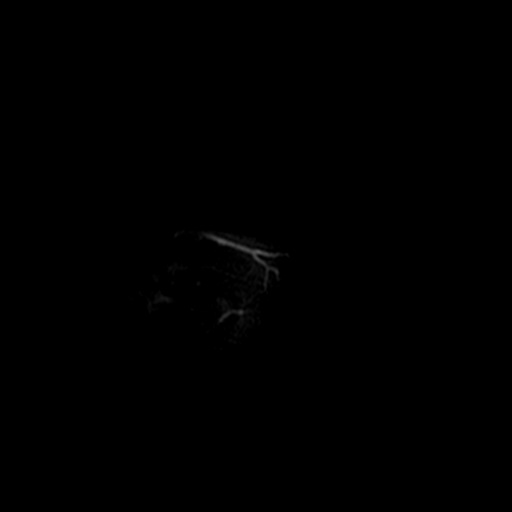

[Series 10: T2 fat-sat · oblique · 4.0mm · 0.27mm/px · 5 of 18 slices shown (2 of 2)]
[im 1/18]
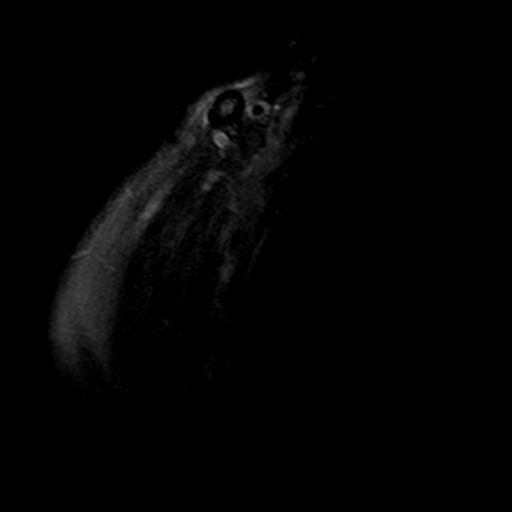
[im 5/18]
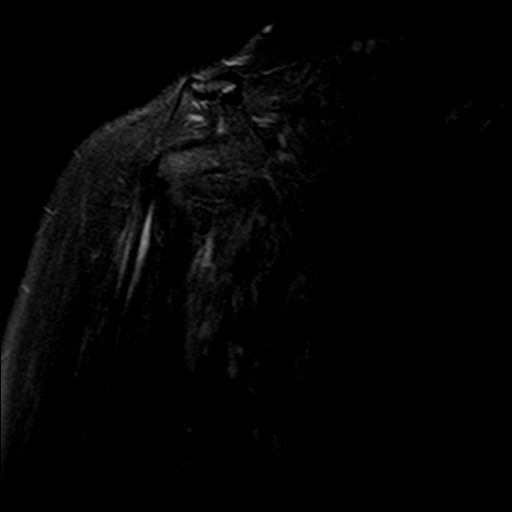
[im 9/18]
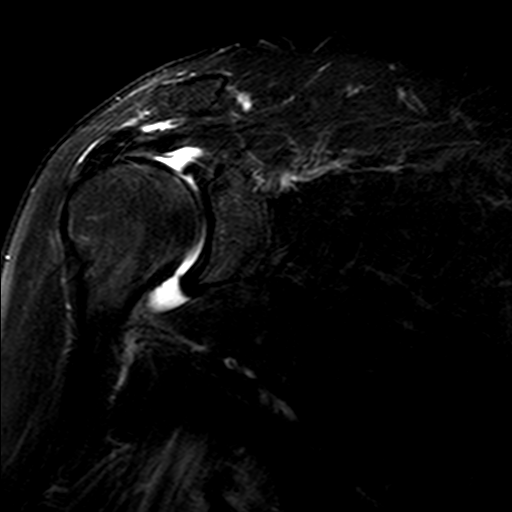
[im 13/18]
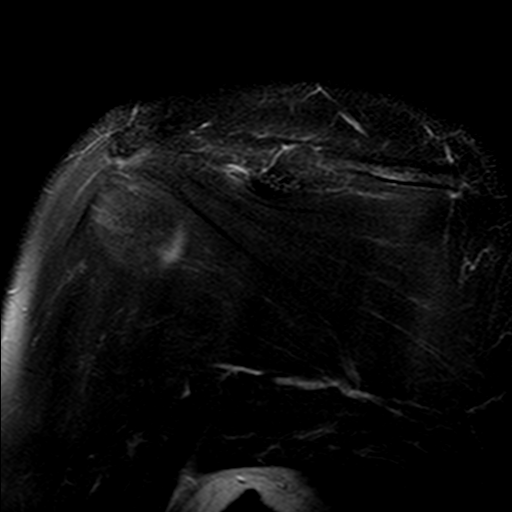
[im 18/18]
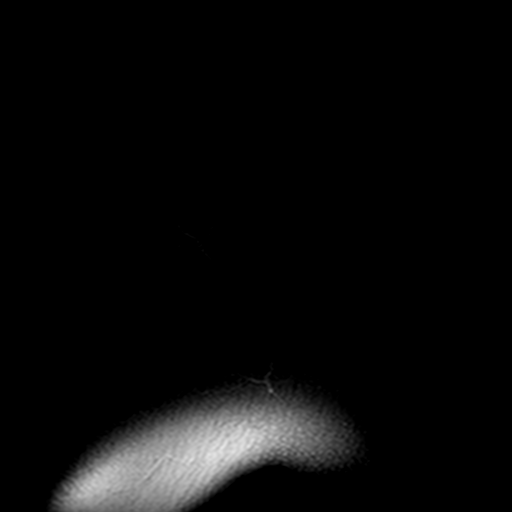

[22 of 40 positions shown; findings below may reference images not displayed]

DIAGNOSTIC STUDIES

EXAM

MAGNETIC RESONANCE IMAGING, LEFT SHOULDER, ANY JOINT OF UPPER EXTREMITY; WITHOUT CONTRAST, CPT

INDICATION

shoulder pain
PAIN TO ANTERIOR SHOULDER X 3 WEEKS, VERY LIMITED ROM, STARTED AFTER WALKING VERY LARGE DOG.  RG

TECHNIQUE

Multisequence and multiplanar MRI of the left shoulder was performed without contrast using
standard departmental protocol.

COMPARISONS

[Compared with previous x-rays of the left shoulder dated 11/15/2019.]

FINDINGS

BONES: No acute fracture or dislocation is identified. The glenohumeral joint space appears
normally maintained. A type 1 acromion is noted. Mild acromioclavicular joint arthropathy with mild
fibers overgrowth of the joint capsule which is most notable on the dorsal aspect of the AC joint.

ROTATOR CUFF: The tendons of the rotator cuff including the distal supraspinatus, distal infra
spinatus, distal teres minor and distal subscapularis tendons appear intact without a full-thickness
rotator cuff tear or significant tendinosis identified. The muscles of the rotator cuff appear
normal in size and signal intensity without abnormal atrophy or fatty infiltration identified.

BICEPS/LABRAL COMPLEX: The proximal long head biceps tendon is normally positioned in the bicipital
groove and appears intact. A small amount of fluid is noted tracking along the proximal long head
biceps tendon within the bicipital tendon sheath. No discrete labral tear is identified.

OTHER: A small amount of fluid is present in the subacromial/subdeltoid bursa and in the
subcoracoid recess along with a small glenohumeral joint effusion. Note is made that the overall
image quality is somewhat degraded by patient motion.

IMPRESSION

1. No full-thickness rotator cuff tear or significant tendinosis of the rotator cuff tendon
identified.
2. Small amount of fluid in the subacromial/subdeltoid bursa and in the subcoracoid recess along
with a small glenohumeral joint effusion.
3. Small amount of fluid tracking along the proximal long head biceps tendon within the bicipital
tendon sheath, which may indicate a component of bicipital tenosynovitis. The biceps tendon itself
appears intact.
4. Mild acromioclavicular joint arthropathy.

Tech Notes:

PAIN TO ANTERIOR SHOULDER X 3 WEEKS, VERY LIMITED ROM, STARTED AFTER WALKING VERY LARGE DOG.  RG

## 2019-11-23 ENCOUNTER — Encounter: Admit: 2019-11-23 | Discharge: 2019-11-23 | Payer: MEDICAID

## 2019-12-24 ENCOUNTER — Encounter: Admit: 2019-12-24 | Discharge: 2019-12-24 | Payer: MEDICAID

## 2019-12-24 DIAGNOSIS — E785 Hyperlipidemia, unspecified: Secondary | ICD-10-CM

## 2019-12-24 LAB — LIPID PROFILE
Lab: 148 — ABNORMAL HIGH
Lab: 235 — ABNORMAL HIGH
Lab: 71
Lab: 73

## 2019-12-28 ENCOUNTER — Encounter: Admit: 2019-12-28 | Discharge: 2019-12-28 | Payer: MEDICAID

## 2019-12-28 DIAGNOSIS — R079 Chest pain, unspecified: Secondary | ICD-10-CM

## 2019-12-28 DIAGNOSIS — F419 Anxiety disorder, unspecified: Secondary | ICD-10-CM

## 2019-12-28 NOTE — Progress Notes
Date of Service: 12/28/2019    Erica Mathis is a 54 y.o. female.       Chief Complaint: Chest pain/premature ventricular contractions.    History of Present Illness:     I had the pleasure of seeing Erica Mathis in our [place] office this afternoon for cardiovascular followup.   ?  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  I first met Erica Mathis in December of 2020, when she was experiencing some chest discomfort.  She described an atypical chest pain, which was sharp in nature and located over her middle chest.  It became so severe that she had been seen in the emergency department.  There was resolution after receiving nitroglycerin and morphine.  She had some associated shortness of breath.  She had a transthoracic echocardiogram which revealed normal left ventricular systolic function, EF 70%.  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  Mild to moderate mitral valve regurgitation.  Ultimately, she was discharged home from the hospital to follow up with Cardiology.  I recommended she obtain a stress myocardial perfusion study to assess for ischemia as the etiology of her chest pain and premature ventricular contractions.  Unfortunately, she did not get the stress testing done.     At our last visit in March 2021 we went ahead and start her on some low-dose metoprolol for frequent premature ventricular contractions.  She had around 7.5% burden on 48-hour Holter monitor.  I also again advised that she get a stress myocardial perfusion study to assess for ischemia as etiology of her premature ventricular contractions.  Unfortunately, she had injured her shoulder and was unable to lift it high enough to complete the study.  She is now in physical therapy and her shoulder pain is improving.  She plans to get the stress test sometime in the next month or 2.  She does seem to be tolerating the metoprolol really quite well, despite having fairly low blood pressure at baseline.  She feels well and has been mowing the lawn without any chest pain, shortness of breath, or palpitations.  She is not experiencing any lightheadedness, dizziness, orthopnea, paroxysmal nocturnal dyspnea, or lower extremity swelling.       Past Medical History:  Patient Active Problem List    Diagnosis Date Noted   ? Anxiety 07/13/2019   ? Chest pain 07/13/2019     09/14/2015 Holter monitor: The patient was monitored for a period of 48 hours and 0 minutes. There were a total of 208493 beats with and AVG RATE of 72. The Maximum BPM was 116 with a Minimum BPM of 42. There were 4 mins < 50bpm and 0 mins > 120 bpm. WIDE BEATS totaled 346 representing 0.2% of all beats. WIDE PAIRS  Totaled 2. WIDE RUNS totaled 1. 2 SEC PAUSE  Totaled 24. NAR RUNS totaled 18. Total EARLY NAR  Were 1058 representing 0.5% of all beats.   06/06/2019 Chest xray: No radiographic evidence of an acute cardiopulmonary process.   06/06/2019 CT abdomen and pelvis with contrast (C/O epigastric/chest pain): Impression: minimal free fluid in Morison's pouch. Visualized loops of small and large bowel are normal in caliber and contour. The appendix appears unremarkable. There is a 2.6 x 2.9 right adnexal cyst.  06/07/2019 Transthoracic echocardiograph: Conclusions: 1. Normal LV systolic function. 2. Frequent PVC's. 3. Mild, bileaflet mitral valve prolapse. 4. Mild to moderate mitral valve regurgitation. 5. Mild  tricuspid valve regurgitation.        Family History: Her mother has atrial fibrillation. ?She is alive and well at the age of 31. ?Her father had a history of stroke. ?He is alive and well at age 35. ?No history of early coronary artery disease, sudden cardiac death, or valvular heart disease. ?Her daughter does have multiple congenital heart problems for which she follows with Dr. Delcie Roch at [facility]. ?They are unable to tell me the exact nature of the heart problems but believe it involves her pulmonary valve.  ?  Social History: Erica Mathis is originally from [place] and has lived here all of her life. 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 ?She is married. ?No other children. ?Lifelong non-smoker. ?No significant alcohol use.    Review of Systems   Constitution: Negative.   HENT: Negative.    Eyes: Negative.    Cardiovascular: Negative.    Respiratory: Negative.    Endocrine: Negative.    Hematologic/Lymphatic: Negative.    Skin: Negative.    Musculoskeletal: Negative.    Gastrointestinal: Negative.    Genitourinary: Negative.    Neurological: Negative.    Psychiatric/Behavioral: Negative.    Allergic/Immunologic: Negative.        Vitals:    12/28/19 1243   BP: 96/54   BP Source: Arm, Left Upper   Patient Position: Sitting   Pulse: 66   SpO2: 99%   Weight: 44 kg (97 lb)   Height: 1.448 m (4' 9)   PainSc: Zero     Body mass index is 20.99 kg/m?Marland Kitchen    Physical Examination:  General Appearance: No acute distress. Fully alert and oriented.  Skin: Warm. No ulcers or xanthomas.   HEENT: Grossly unremarkable. Lips and oral mucosa without pallor or cyanosis. Moist mucous membranes.   Neck Veins: Normal jugular venous pressure. Neck veins are not distended.  Carotid Arteries: Normal carotid upstroke bilaterally. No bruits.  Chest Inspection:  Chest is normal in appearance.  Auscultation/Percussion: Normal respiratory effort. Lungs clear to auscultation bilaterally. No wheezes, rales, or rhonchi.    Cardiac Rhythm: Regular rhythm. Normal rate.  Cardiac Auscultation: Normal S1 & S2. No S3 or S4. No rub.  Murmurs: No cardiac murmurs.  Peripheral Circulation: Normal peripheral circulation.   Abdominal Aorta: No abdominal aortic bruit.  Extremities: Appropriately warm to touch. No lower extremity edema.  Abdominal Exam: Soft, non-tender. No masses, no organomegaly. Normal bowel sounds.  Neurologic Exam: Neurological assessment grossly intact.       Assessment and Plan:  1. Premature ventricular contractions:  Tama was found to have premature ventricular contractions on her echocardiogram completed in October of 2020.  She had normal left ventricular systolic function and mild to moderate mitral valve regurgitation on that study.  We went ahead and obtained a 48 hour Holter monitor which demonstrated a 7.5% premature ventricular contraction burden.  This was a change from her prior Holter monitor in 2017, which showed very rare premature ventricular contractions.  We had opted to pursue a regadenoson thallium myocardial perfusion study.  She was unable to obtain this due to a recent shoulder injury.  I did start her on very low-dose metoprolol 12.5 mg b.i.d. for which she has been taking without any side effects.  Her blood pressure today is 96/54 mmHg.  She feels well on the medication.  I again advised her to get her stress test done sometime in  the next month or 2 once her shoulder is a little bit better.  She is agreeable.  Will plan to see her back in followup afterwards.  At that time, I would probably repeat a 48 hour Holter monitor to reassess her PVC burden.     2.  Mild to moderate mitral valve regurgitation:  This was present on transthoracic echocardiogram from October 2020.  Will plan to repeat echocardiogram in 1 year.     3.     Elevated LDL:  Aurea does have an elevated LDL on recent lipid panel.  On 12/24/2019, her triglycerides are 71, HDL 73, LDL 148.  She is working on dietary modifications.  Depending on the results of her stress  test, we may need to consider starting her on statin therapy in the future.  She may also benefit from a CT calcium score.     I really enjoyed seeing Lakshana back in the office today and I appreciate the opportunity to take part in her care.  I look forward to seeing her back in about 3 months.  If I can be of any assistance in the interim, please do not hesitate to reach out with questions or concerns.           Total time spent on today's office visit was 35 minutes. This includes face-to-face in person visit with patient as well as non face-to-face time including review of the electronic medical record, outside records, labs, radiologic studies, cardiovascular studies, formulation of treatment plan, after visit summary, future disposition, personal discussions, and documentation.    Current Medications (including today's revisions)  ? acetaminophen SR (TYLENOL) 650 mg tablet Take 650 mg by mouth every 6 hours as needed for Pain.   ? aspirin EC 81 mg tablet Take 81 mg by mouth daily. Take with food.   ? diclofenac (VOLTAREN) 1 % topical gel Apply 2 g topically to affected area twice daily.   ? metoprolol XL (TOPROL XL) 25 mg extended release tablet Take one-half tablet by mouth at bedtime daily.   ? nitroglycerin (NITROSTAT) 0.4 mg tablet Place 0.4 mg under tongue every 5 minutes as needed for Chest Pain. Max of 3 tablets, call 911.   ? ondansetron (ZOFRAN) 4 mg tablet Take 4 mg by mouth every 6 hours as needed for Nausea or Vomiting.   ? pantoprazole DR (PROTONIX) 40 mg tablet Take 40 mg by mouth daily.   ? rosuvastatin (CRESTOR) 10 mg tablet Take one tablet by mouth daily.

## 2020-03-30 ENCOUNTER — Encounter: Admit: 2020-03-30 | Discharge: 2020-03-30 | Payer: MEDICAID

## 2020-03-30 DIAGNOSIS — R079 Chest pain, unspecified: Secondary | ICD-10-CM

## 2020-03-30 DIAGNOSIS — F419 Anxiety disorder, unspecified: Secondary | ICD-10-CM

## 2020-03-30 NOTE — Patient Instructions
[facility] Nuclear Stress Test Instructions    PLEASE REPORT TO:    ____[facility] ([address], [place], [place]) - [phone]   ____[facility] ([address], [place], [place]) - [phone]    ____[facility] ([address] [place], [place]) - [phone]    ____[facility] ([address], [place], [place]) - [phone]   ____[facility] ([address] [place], [place]) - [phone]     [facility] Main Phone Number: [phone]     Date of Test  ____________  at ____________  for ________________________    Are you able to raise your arm up by your head for about 20 minutes? yes  Can you lie on your back for approximately 20 minutes with minimal movement? yes    The Thallium evaluation has two parts -- two nuclear scans.  The first scan is done in the morning and the second three to four hours later.   Wear comfortable clothing. Shorts or pants. (No dresses or skirts please).  Bring or wear sneakers/walking shoes if you are walking on T[facility]mill.  Please let the nuclear technologists know if you plan on flying after the test.    NO CAFFEINE 24 HOURS PRIOR TO TEST. Examples: coffee, tea, decaf drinks, cola, chocolate.     DO NOT EAT OR DRINK THE MORNING OF YOUR TEST unless otherwise instructed. (You may have a couple sips of water).    If you are a diabetic, if insulin dependent: please take one third of your insulin with two pieces of dry toast and a small juice). Bring insulin and medication with you to the test.     ___ TAKE MORNING MEDICATIONS WITH A COUPLE SIPS OF WATER PRIOR TO TEST.     HOLD THE FOLLOWING MEDICATIONS AS INDICATED BELOW:      ? none       WHAT TO DO BETWEEN THE FIRST TWO THALLIUM SCANS:  1. No strenuous exercise should be performed during this time.  2. A light lunch is permissible. The technologist will give you a list of appropriate foods.  3. Please return 15 minutes prior to the schedule of your second scan. Our nuclear technologist will tell you exactly what time to return.  4. Please do not use tobacco products in between scans.  5. After the first scan is completed, you may resume usual medications.     TEST FINDINGS:  You will receive the results of the test within 7 business days of its completion by telephone, unless arranged differently at the time of the procedure.  If you have any questions concerning your thallium test or if you do not hear from your CVM physician/or nurse within 7 business days, please call the appropriate office checked above.      Instructions given by Florene Route, RN           Please schedule stress test [phone]    Follow up with Dr. Sandria Manly in 6 months

## 2020-04-18 ENCOUNTER — Encounter: Admit: 2020-04-18 | Discharge: 2020-04-18 | Payer: MEDICAID

## 2020-04-18 ENCOUNTER — Ambulatory Visit: Admit: 2020-04-18 | Discharge: 2020-04-18 | Payer: MEDICAID

## 2020-04-18 DIAGNOSIS — R079 Chest pain, unspecified: Secondary | ICD-10-CM

## 2020-04-18 IMAGING — NM NM stress 1[PERSON_NAME]<250 M<275
8 series · 60 of 60 positions shown · non-contrast
Comparison: none

04/18/20 - Procedure: ADAC MULTI GATED SESTAMIBI REGADENOSON MPI STRESS TEST
Duration and Sequencing:  Same-Day Rest/Stress

Interpretation Summary
Nuclear Report
Division of Nuclear Cardiac Imaging
Consultation Report
EXAMINATION:  Gated Eechnetium-QQm Sestamibi Same-Day Rest/Stress myocardial perfusion single-photon
emission computed tomography resting regional wall function, post stress ejection fraction, and
perfusion imaging utilizing Regadenoson pharmacological stress.
Study #:  [id number]
[facility] MRN:  [id number]
[HOSPITAL] ID:
BMI: 20.04 kg/m2
INDICATIONS FOR STUDY (HISTORY):  This is a 54 year old female with a history of hypertension and
hyperlipidemia.  The patient has recently been experiencing chest pain.
PROCEDURAL DETAILS:  This Gated Same-Day Rest/Stress perfusion study was performed using Technetium-
99m Sestamibi as the perfusion agent.  Initially, at rest the patient received an intravenous
injection 8.4 mCi of Eechnetium-QQm Sestamibi. Planar and gated tomographic images were acquired.
Subsequently, the patient received a 5 ml intravenous infusion of Regadenoson at 0.08 mg/ml over 10
to 15 seconds.  Approximately 20 seconds later 25.5 mCi of Eechnetium-QQm Sestamibi was injected
intravenously.  Continuous electrocardiographic monitoring and serial electrocardiograms were
obtained, as well as intermittent blood pressure recordings. Planar and gated tomographic images
were subsequently acquired.  Rest images were compared to post stress images.

[(person_name) · 4.52mm/px · 1 of 1 slices shown]
[im 1/1]
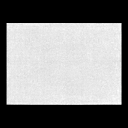

[rstmac statics · 2.26mm/px · 1 of 2 frames shown]
[frame 1/2]
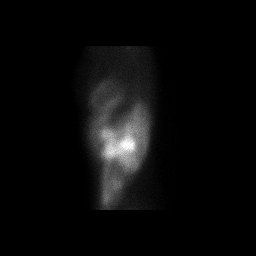

[rest · 6.78mm/px · 3 of 72 frames shown]
[frame 7/72]
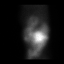
[frame 31/72]
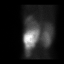
[frame 55/72]
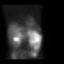

[strmac static · 2.26mm/px · 1 of 2 frames shown]
[frame 1/2]
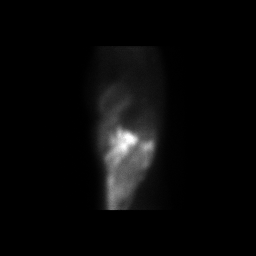

[sgate · 6.78mm/px · 3 of 576 frames shown]
[frame 49/576]
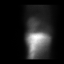
[frame 241/576]
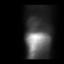
[frame 433/576]
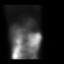

[cardiac spect · 6.78mm/px · 14 acquisitions, 34 frames shown]
[im 1/14]
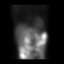
[im 1/14]
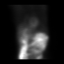
[im 1/14]
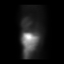
[im 2/14]
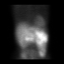
[im 2/14]
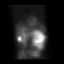
[im 2/14]
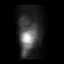
[im 2/14]
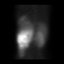
[im 3/14]
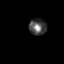
[im 3/14]
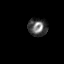
[im 3/14]
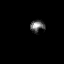
[im 4/14]
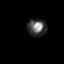
[im 4/14]
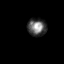
[im 4/14]
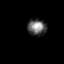
[im 6/14]
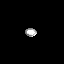
[im 6/14]
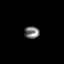
[im 6/14]
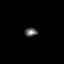
[im 7/14]
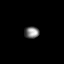
[im 7/14]
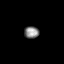
[im 7/14]
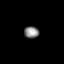
[im 8/14]
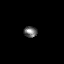
[im 8/14]
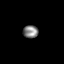
[im 8/14]
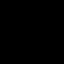
[im 10/14]
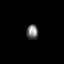
[im 10/14]
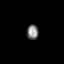
[im 10/14]
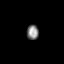
[im 11/14]
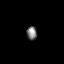
[im 11/14]
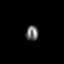
[im 11/14]
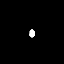
[im 12/14]
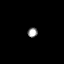
[im 12/14]
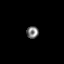
[im 12/14]
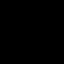
[im 14/14]
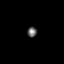
[im 14/14]
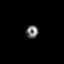
[im 14/14]
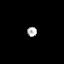

[qgsqps_results · 6.8mm · 6.78mm/px · 2 acquisitions, 6 frames shown]
[im 1/2]
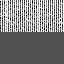
[im 1/2]
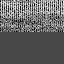
[im 1/2]
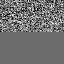
[im 2/2]
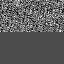
[im 2/2]
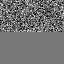
[im 2/2]
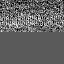

[myometrix results · 6.8mm · 6.78mm/px · 7 acquisitions, 11 frames shown]
[im 1/7]
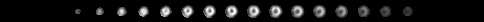
[im 1/7]
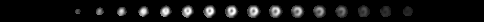
[im 1/7]
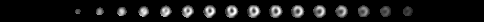
[im 2/7]
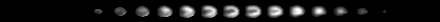
[im 2/7]
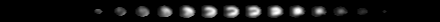
[im 2/7]
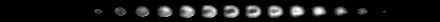
[im 3/7]
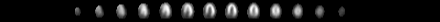
[im 3/7]
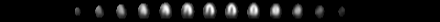
[im 3/7]
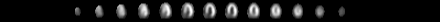
[im 4/7]
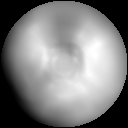
[im 7/7]
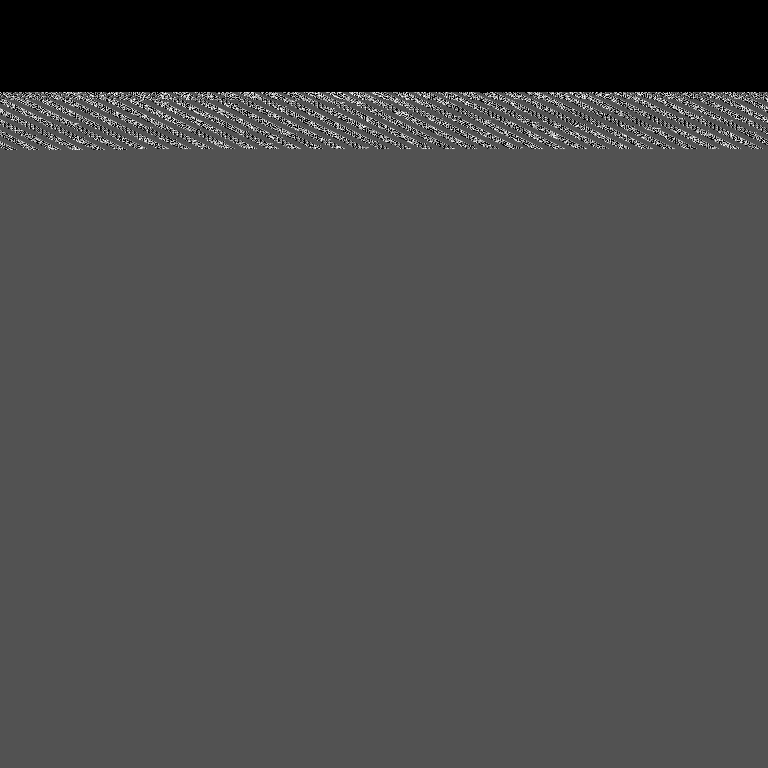

[60 of 60 positions shown; findings below may reference images not displayed]

FINDINGS: Pharmacological Stress Electrocardiogram:  The patient's resting heart rate was 49 bpm and the
resting blood pressure was 122/74.  The patient?s peak stress heart rate was 104 bpm and the peak
stress blood pressure was 108/65.  The patient experienced shortness of breath.

The resting ECG shows Normal sinus rhythm.  Following Regadenoson infusion there are rare isolated
premature ventricular ectopic beats.
CONCLUSION: Pharmacologic stress ECG is negative for ischemia.

Pulmonary-to-Myocardial Count Ratio:  0.24  (normal = or < 0.52).

Scintigraphic (planar/tomographic):  Planar images reveal the left ventricular cavity is normal in
size.  There is normal pulmonary tracer uptake.  There is no significant attenuation present. No
transient ischemic dilation is present.

Tomographic images were reconstructed in three orthogonal views.  There is normal homogenous uptake
of thallium in all myocardial segments.  There are no perfusion defects.  All myocardial segments
appear viable.

Polar coordinate map identifies no perfusion abnormalities.

TID Ratio:  1.02  (normal <1.36).

Summed Stress Score:  0   , Summed Rest Score:  0

Regional Wall Thickening and Motion Post Stress:   There is normal left ventricular wall motion and
thickening of all myocardial segments.

Left Ventricular Ejection Fraction (post stress, in the resting state) =  72 %.

Left Ventricular End Diastolic Volume: 50 mL
SUMMARY/OPINION:
1.  Normal myocardial perfusion scan.  There is no evidence of ischemia or infarction.
2.  Normal left ventricular systolic function with normal segmental wall motion and thickening.
3.  Low risk myocardial perfusion scan.

There are no prior studies available for comparison.

[facility] ECG

Tech Notes:

## 2020-04-19 ENCOUNTER — Encounter: Admit: 2020-04-19 | Discharge: 2020-04-19 | Payer: MEDICAID

## 2020-04-19 NOTE — Telephone Encounter
Discussed stress test results with the patient. Patient does not have any further questions or concerns at this time.

## 2020-04-19 NOTE — Telephone Encounter
-----   Message from Altamease Oiler, MD sent at 04/19/2020  1:55 PM CDT -----  Hey guys,Would you mind reaching out to [facility] and letting her know that her stress test looked great!  Nothing to suggest blockages in her heart arteries as the cause of her premature ventricular contractions.Thanks!

## 2021-01-02 ENCOUNTER — Encounter: Admit: 2021-01-02 | Discharge: 2021-01-02 | Payer: MEDICAID

## 2021-01-02 DIAGNOSIS — F419 Anxiety disorder, unspecified: Secondary | ICD-10-CM

## 2021-01-02 DIAGNOSIS — R079 Chest pain, unspecified: Secondary | ICD-10-CM

## 2021-01-02 DIAGNOSIS — I340 Nonrheumatic mitral (valve) insufficiency: Secondary | ICD-10-CM

## 2021-01-10 IMAGING — US BREASTLT
1 series · 14 of 25 positions shown · non-contrast
Comparison: none

[Series 1: us breast left limited · 31 acquisitions, 14 frames shown]
[im 1/31]
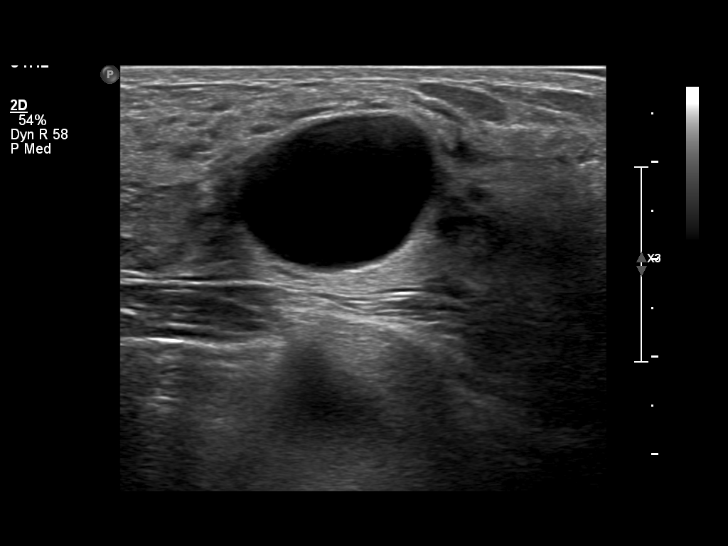
[im 3/31]
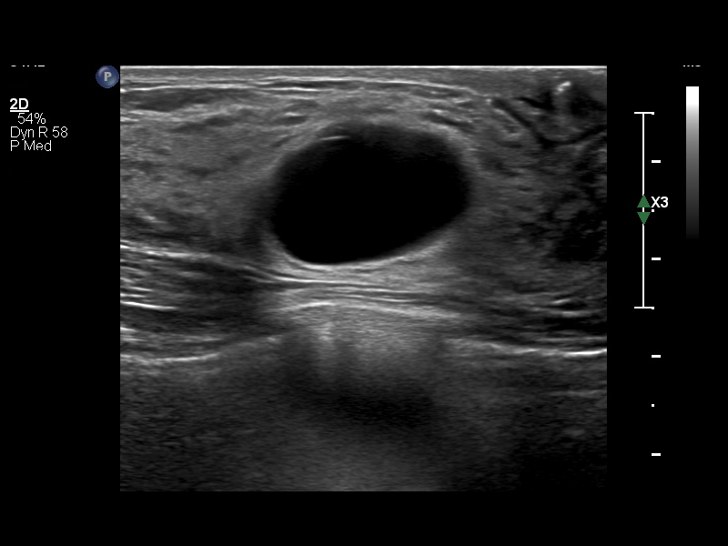
[im 6/31]
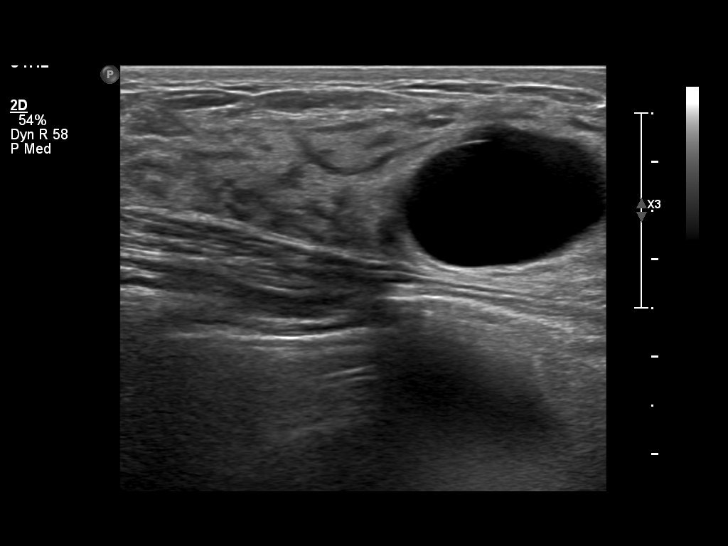
[im 8/31]
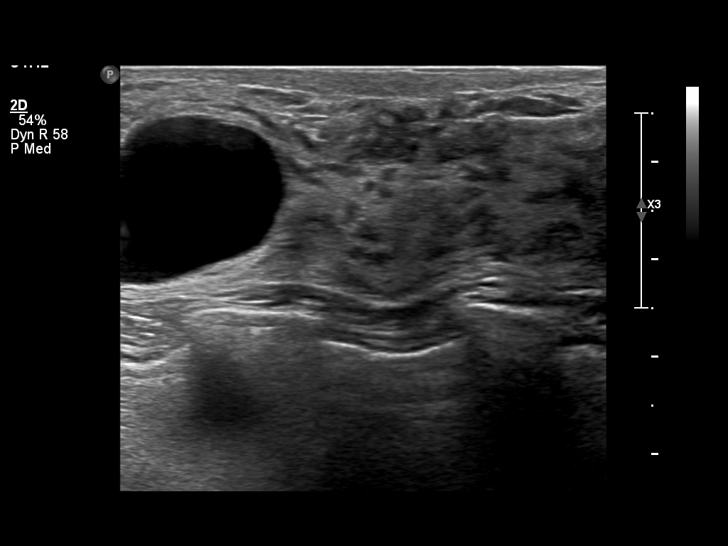
[im 11/31]
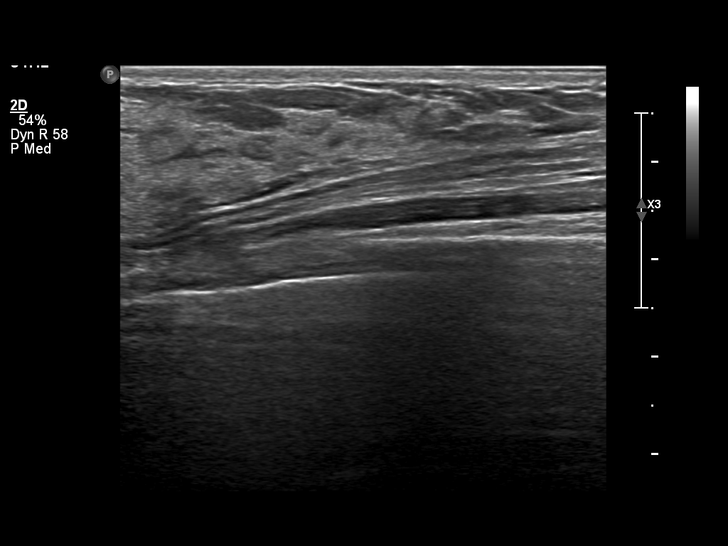
[im 12/31]
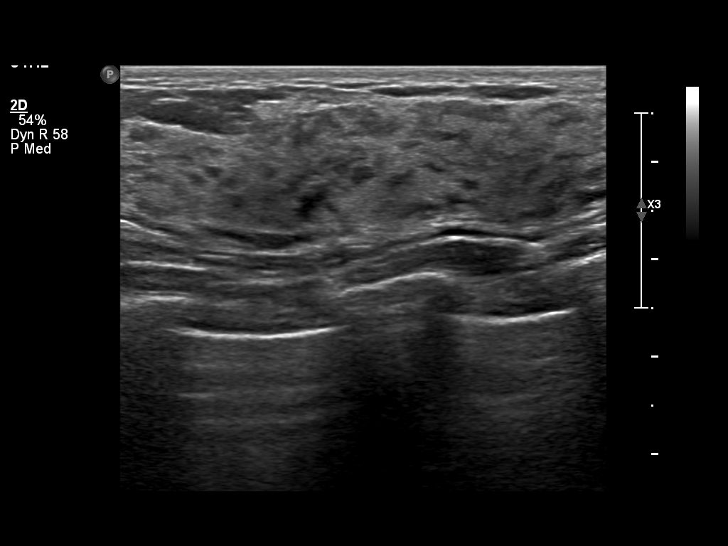
[im 14/31]
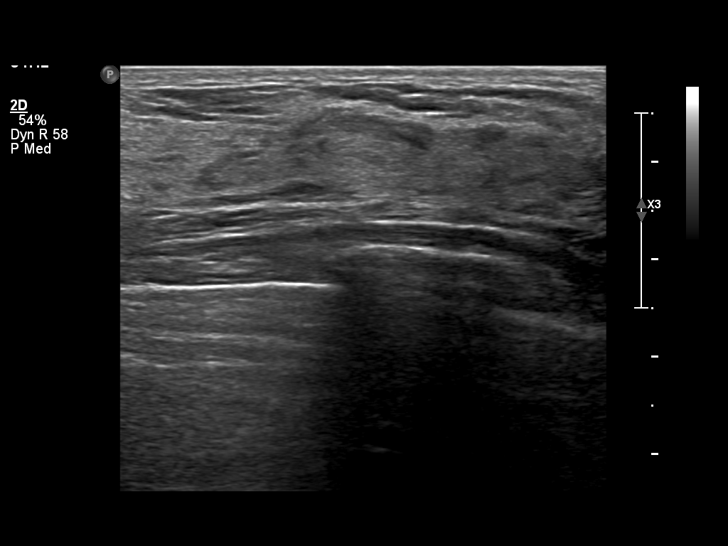
[im 17/31]
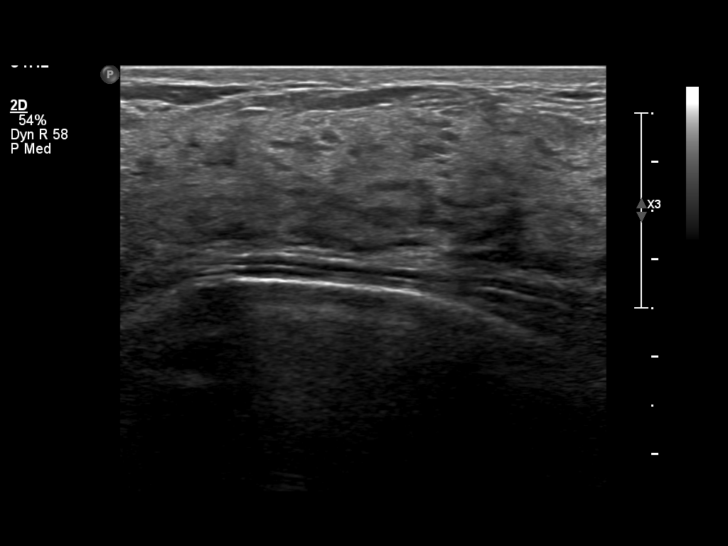
[im 19/31]
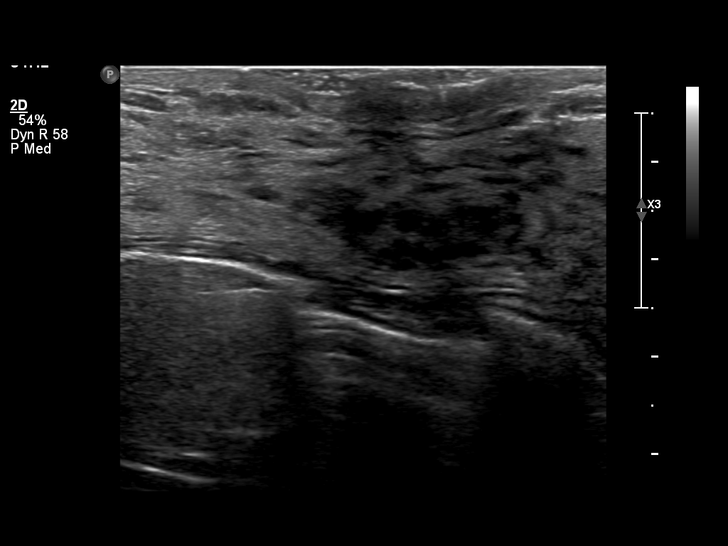
[im 21/31]
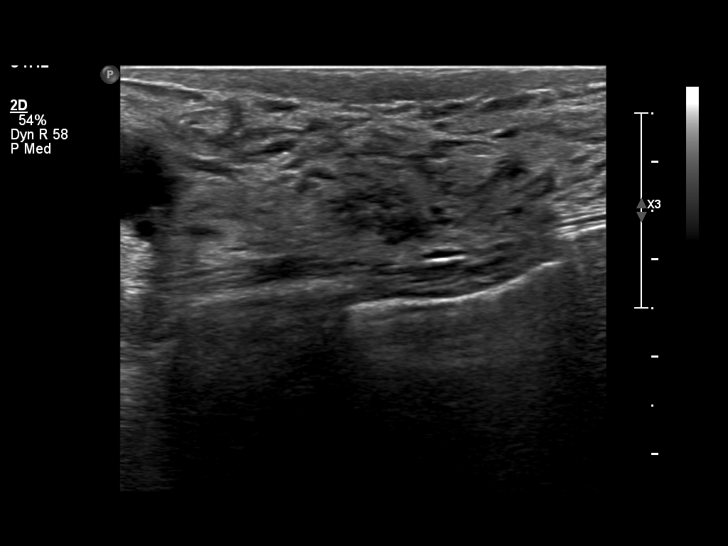
[im 23/31]
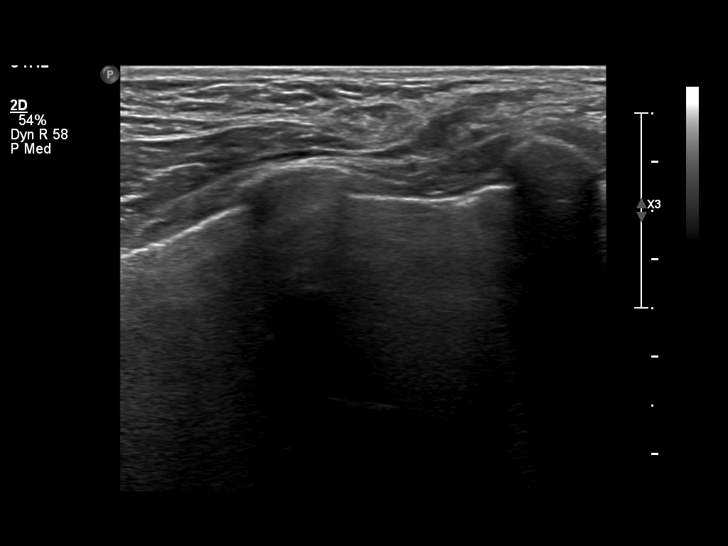
[im 26/31]
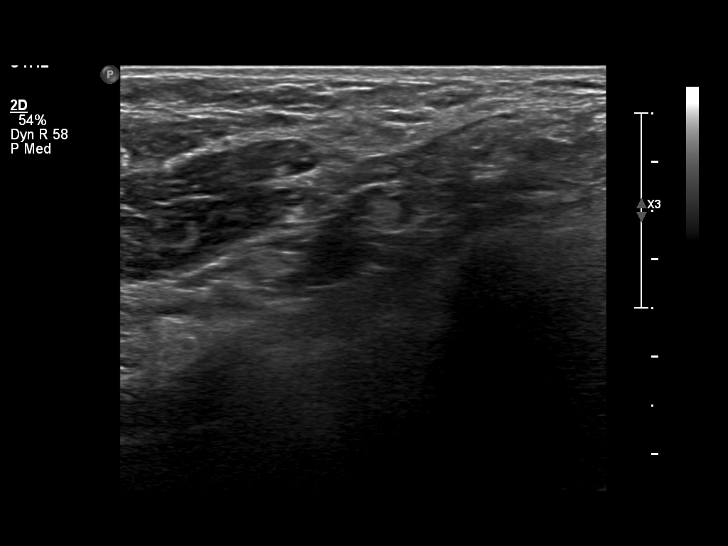
[im 28/31]
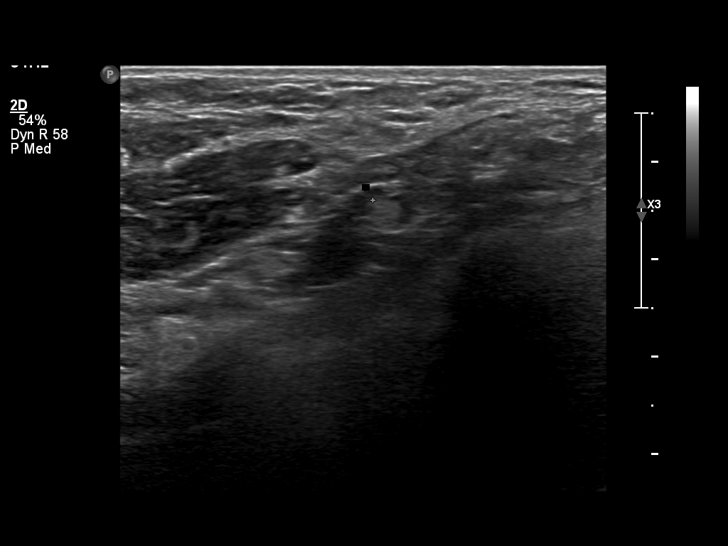
[im 31/31]
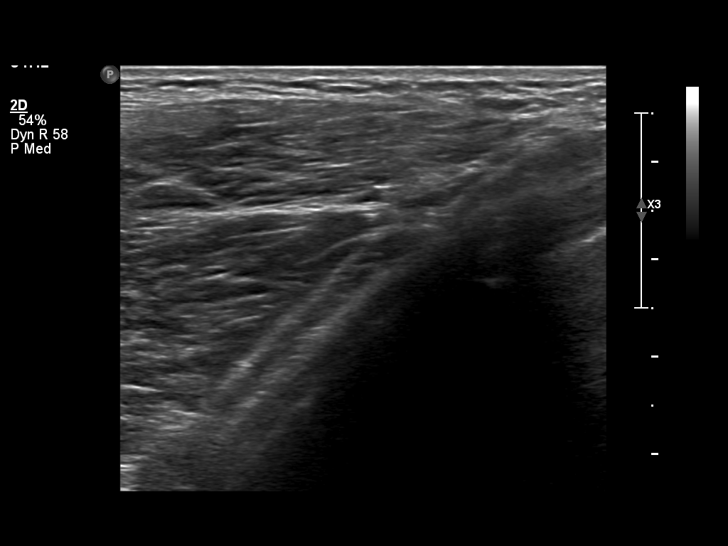

[14 of 25 positions shown; findings below may reference images not displayed]

DIAGNOSTIC STUDIES

US BREAST

INDICATION

Lump

TECHNIQUE

Grayscale and Color Doppler imaging evaluation in the region of concern was performed

COMPARISONS

Mammogram from the same day

FINDINGS

In the area concern, large cyst with posterior acoustic enhancement measuring up to 2.3 cm,
parallel, avascular. Minimal thickening along the lateral margin which may represent inflammation.
No hyperemia. Smaller subjacent 4-5 millimeter cyst. Dense fibroglandular tissue. Normal left
axillary lymph node.

IMPRESSION
1.  Simple appearing possibly inflamed cyst without internal debris corresponding with the area of
palpable concern.
2. Patient was counseled if there is continued pain and request for symptomatic relief, consider
ultrasound-guided needle aspiration.
3. BI-RADS 2, BENIGN.

A reminder letter will be sent.

Tech Notes:

## 2021-01-10 IMAGING — MG MAMMOGRAM 3D SCREEN, BILATERAL
9 series · 9 of 9 positions shown · non-contrast
Comparison: none

[R CC]
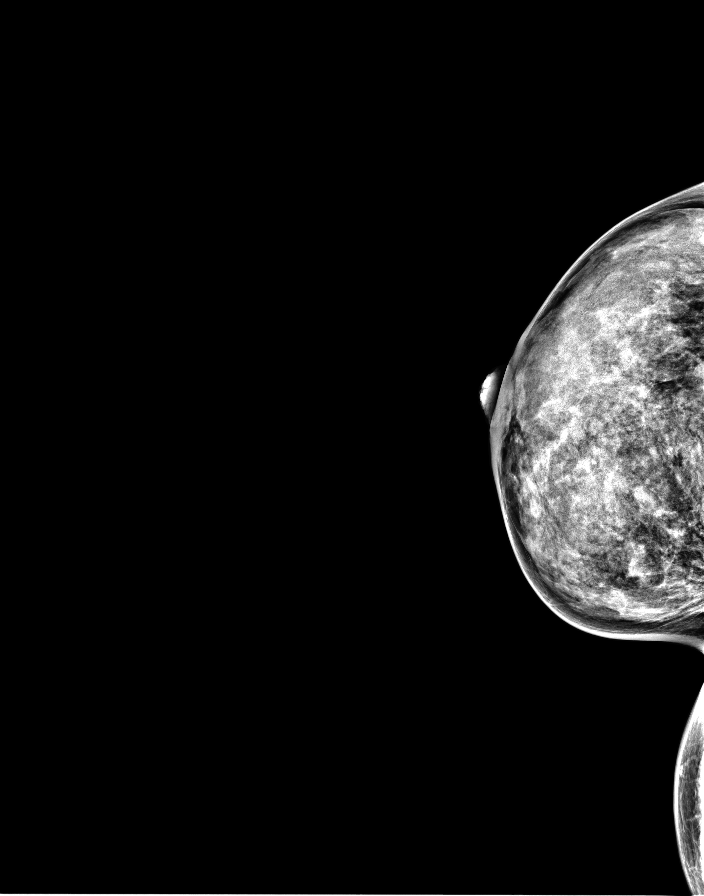

[R tomo (1 of 2)]
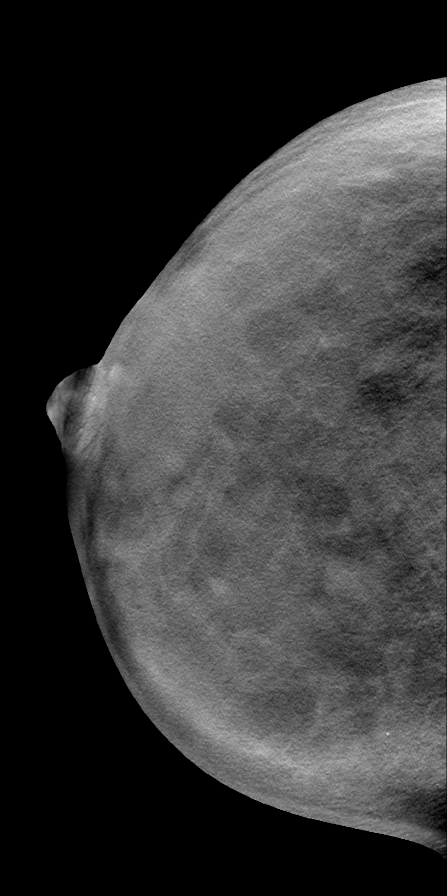

[R XCC]
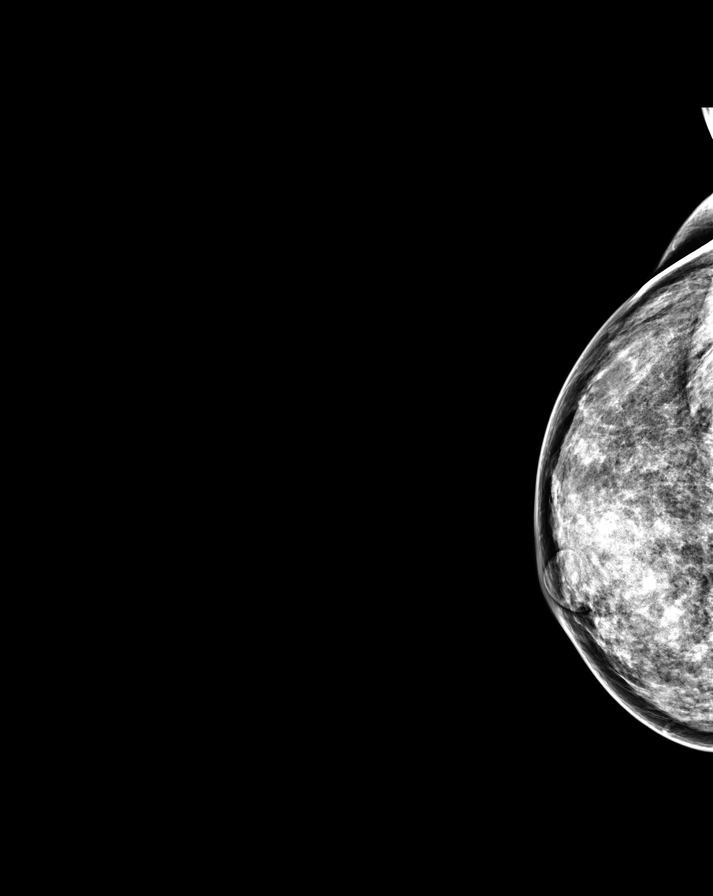

[L CC]
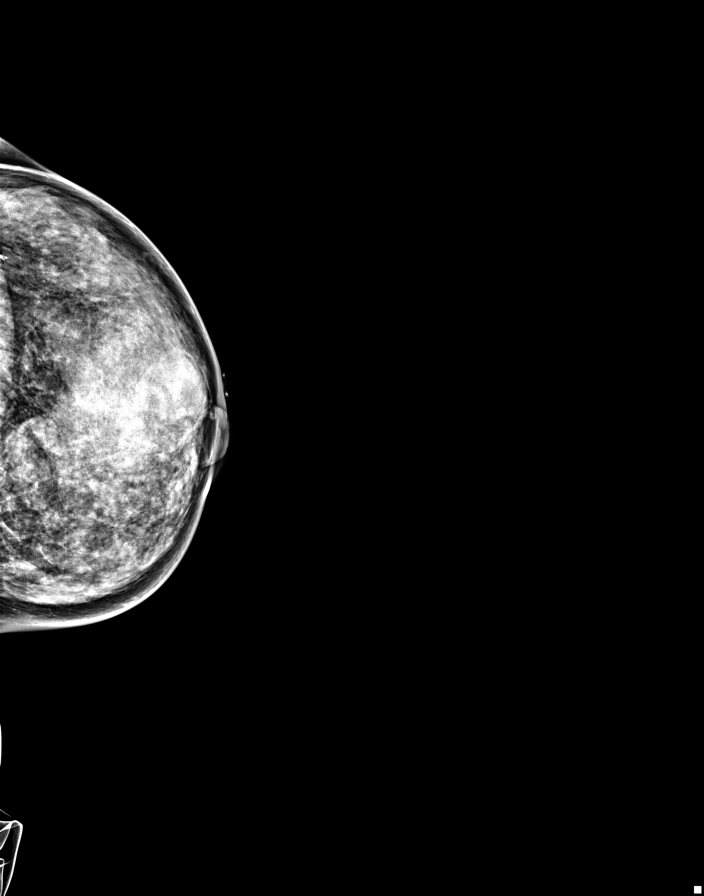

[L tomo (1 of 2)]
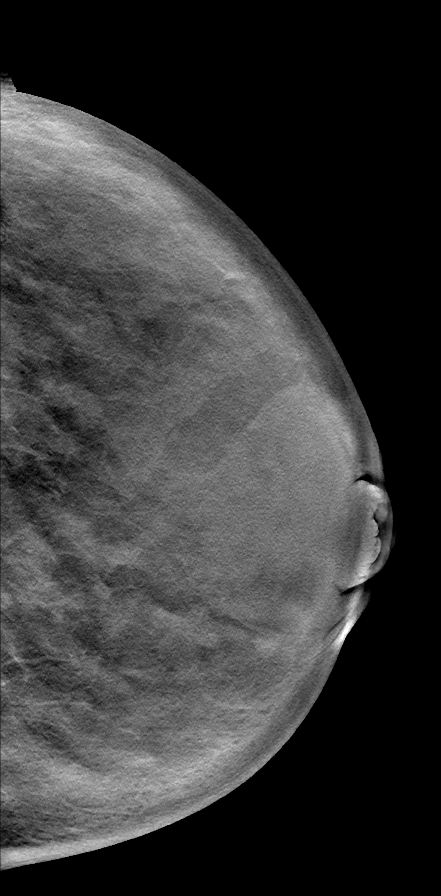

[R MLO]
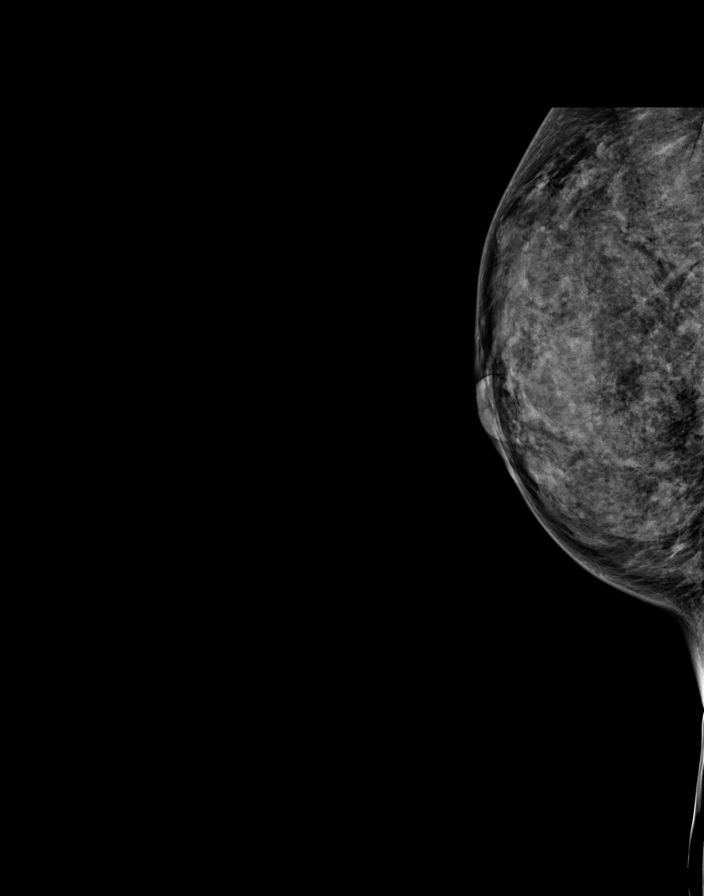

[R tomo (2 of 2)]
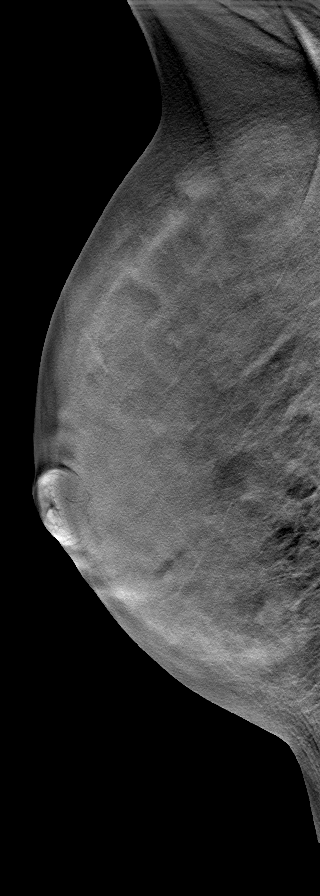

[L MLO]
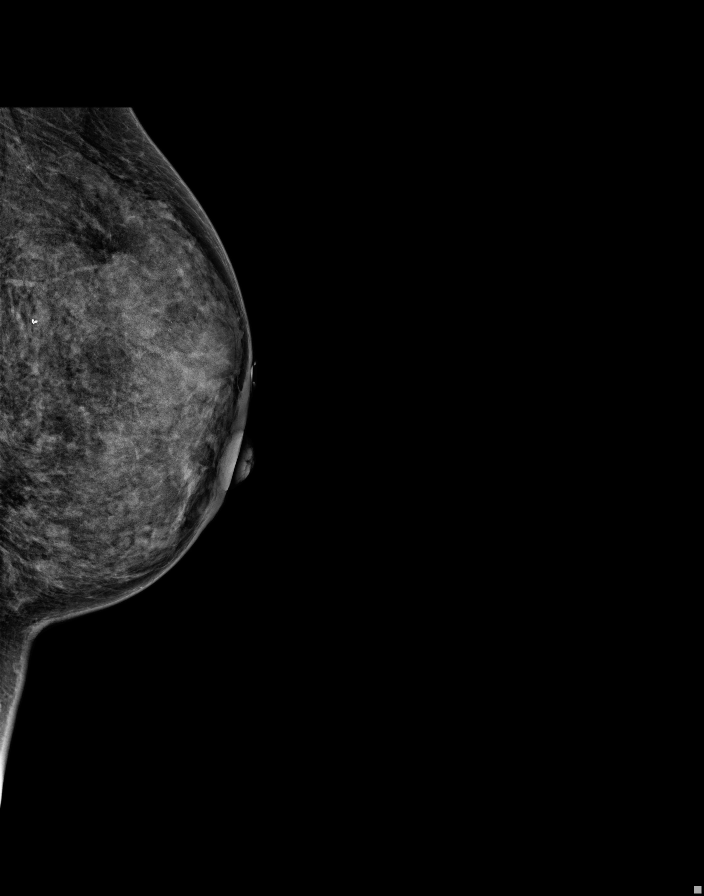

[L tomo (2 of 2)]
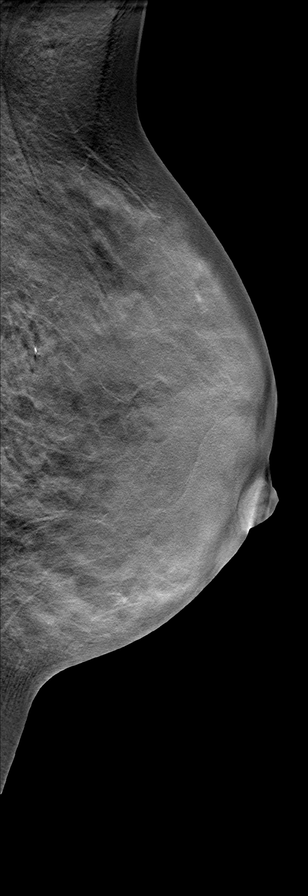

[9 of 9 positions shown; findings below may reference images not displayed]

EXAM

MM mammogram 3d dx bilat

INDICATION

1.5 cm mobile lump [DATE]
LUMP BIGGER THAN QUARTER SIZED X 10 DAYS.  [DATE], ANTERIOR BREAST.  HX. BENIGN FINE NEEDLE BX [phone].
MOTHER DX @ 75, PAT AUNT DX @ 40.  TC SCORE: 10 YR= 9.6%, LIFETIME = 28.4%.  DX BIL.  AB (3D)
PRIORS: 0044.

TECHNIQUE

2D and tomosynthesis digital craniocaudal and mediolateral oblique views were obtained of both
breasts. Computer aided detection software was utilized. Additional magnification views of the left
lateral breast in the craniocaudal to medial lateral and medial lateral oblique projections.

COMPARISONS

07/23/18

FINDINGS

Heterogenous breast tissue density, which may obscure small masses.

No suspicious spiculated mass or architectural distortion.

Scattered punctate monomorphic calcifications within the left lateral breast upon magnification
views with long-standing unchanged closely grouped coarse heterogeneous calcifications within the
left posterior upper outer breast from 0044. Overall imaging findings are favored to be benign and
may be related to fibrocystic change.

Recommend targeted ultrasound in the left upper outer breast and targeted to the area of palpable
abnormality.

On tomosynthesis, there are multiple large cysts in the left anterior breast.

IMPRESSION
1. BI-RADS 0, INCOMPLETE: Need Additional Imaging Evaluation.

Tech Notes:

## 2021-04-25 IMAGING — CR [ID]
1 series · 1 of 1 positions shown · non-contrast
Comparison: none

[x chest ap]
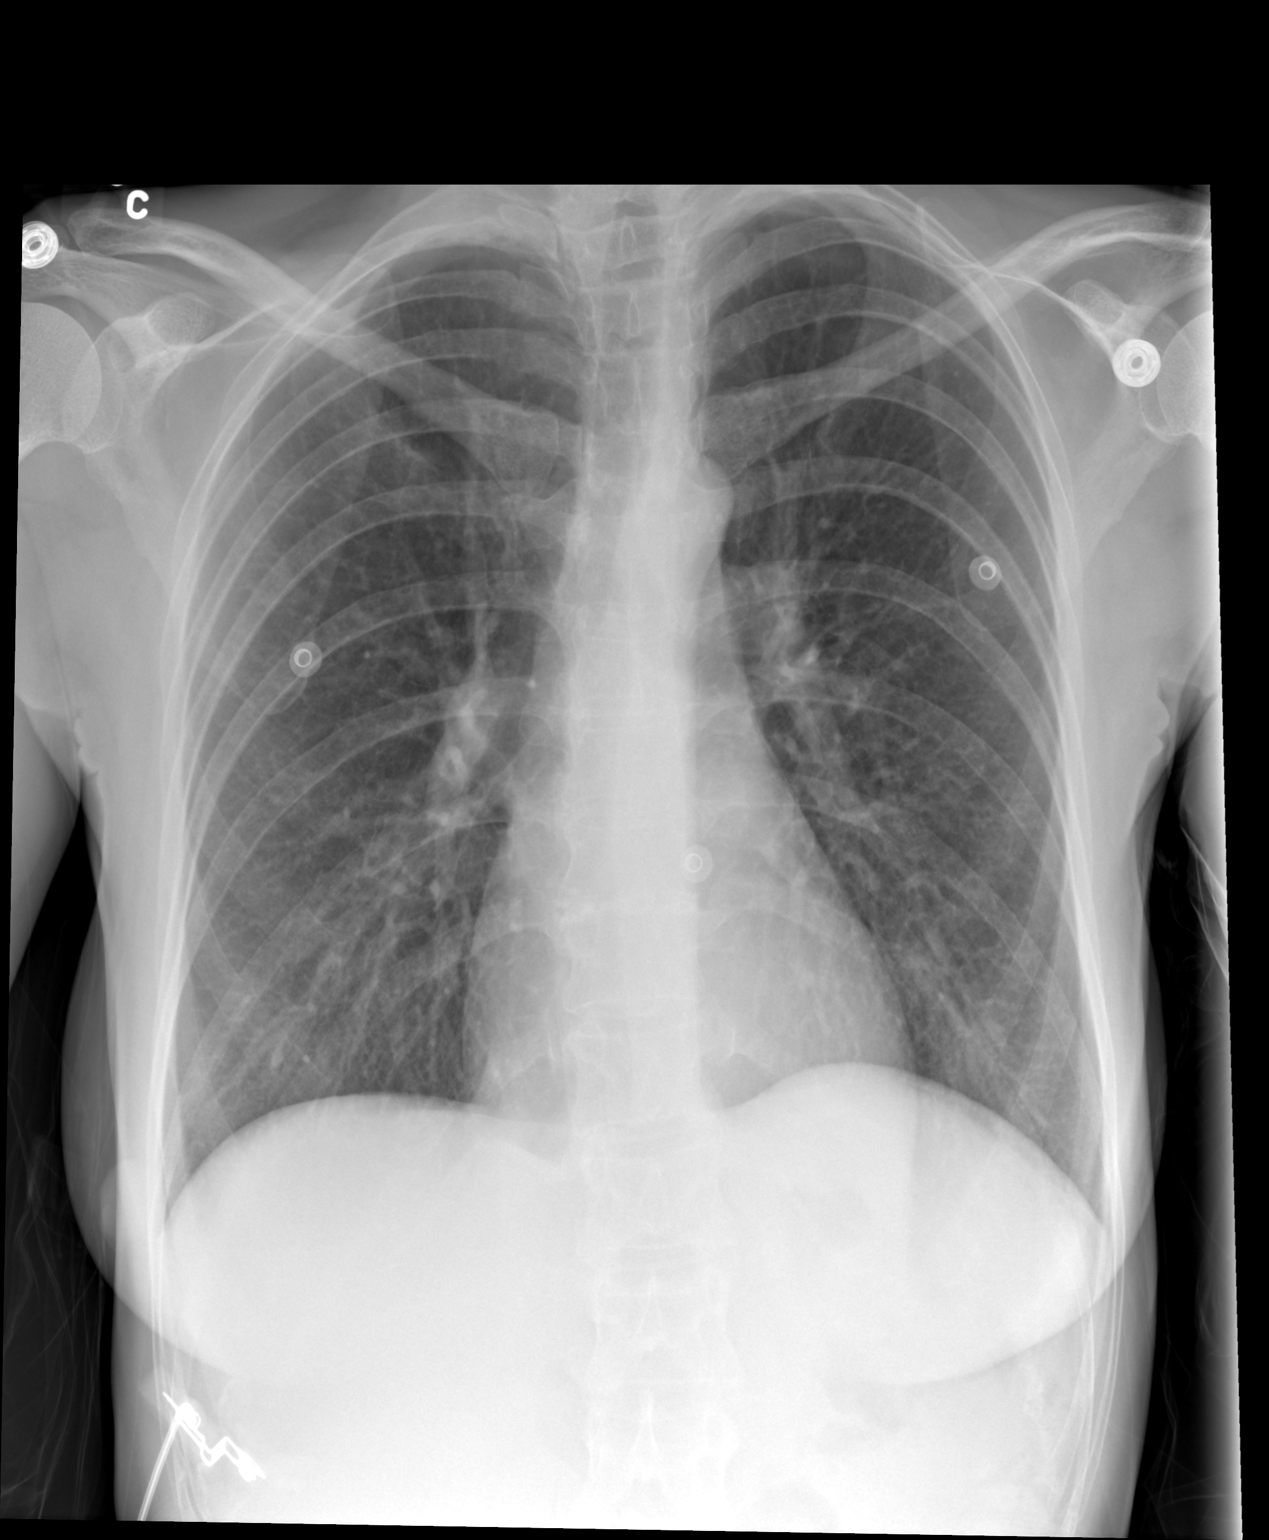

[1 of 1 positions shown; findings below may reference images not displayed]

DIAGNOSTIC STUDIES

EXAM

XR chest 1V

INDICATION

cp
Chest pain.

TECHNIQUE

AP VIEW

COMPARISONS

06/06/2019

FINDINGS

The heart is normal. No focal consolidation, pleural effusion or pneumothorax. No aggressive
osseous lesions.

IMPRESSION

No acute pulmonary findings.

Tech Notes:

Chest pain.

## 2021-04-25 IMAGING — CT PE(Adult)
3 of 5 series · 14 of 36 positions shown · non-contrast
Comparison: none

[Series 4: cta pulmonary ax 2.00 bv36 s3 · axial · 0.41mm/px · z∈[+1521,+1559]mm · 2 of 150 slices shown]
[im 19/150  lung]
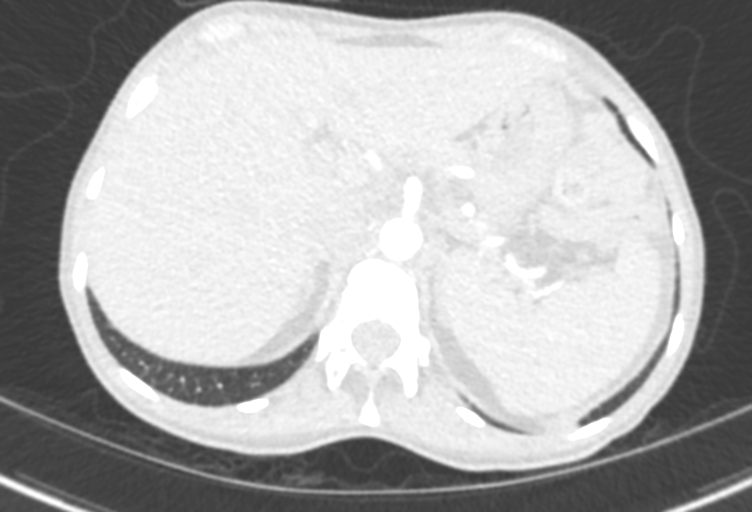
[im 38/150  lung]
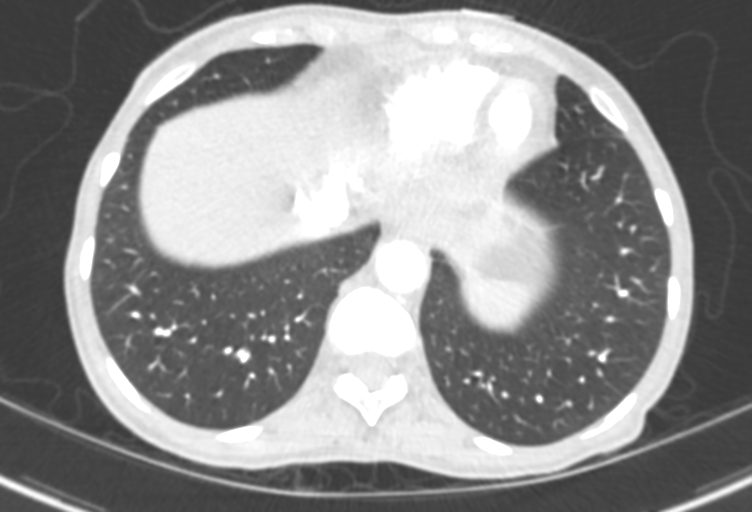

[Series 6: cta pulmonary cor 2.00 bv36 s3 · coronal · 0.59mm/px · 3 of 104 slices shown]
[im 21/104  mediastinal]
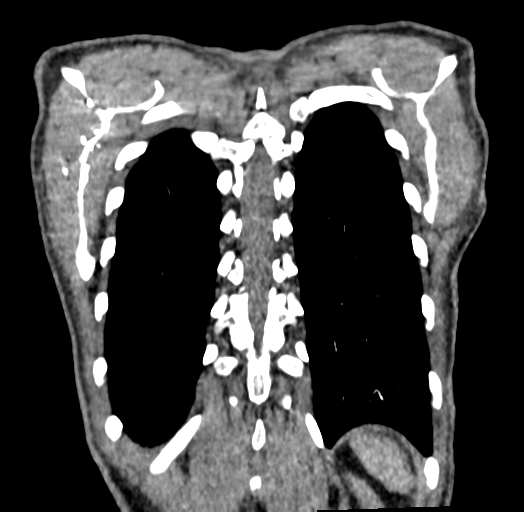
[im 42/104  mediastinal]
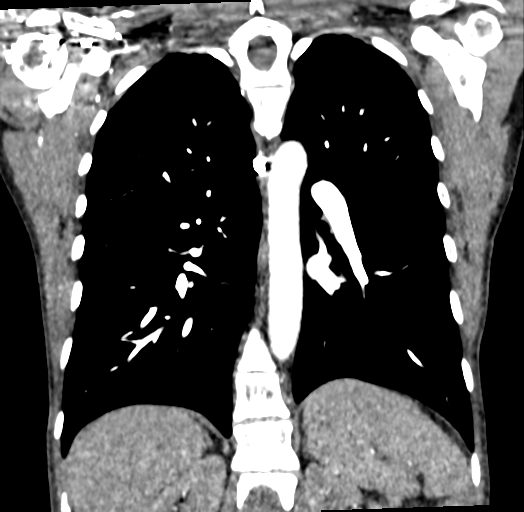
[im 62/104  mediastinal]
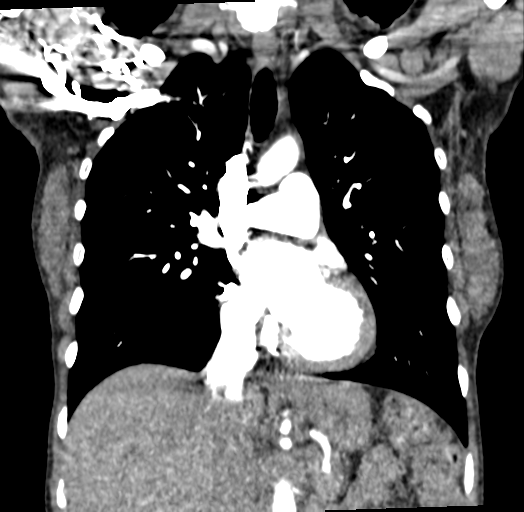

[Series 12: cta pulmonary ax 1.50 br60 s3 · axial · 0.42mm/px · z∈[+1513,+1750]mm · 9 of 198 slices shown]
[im 20/198  lung]
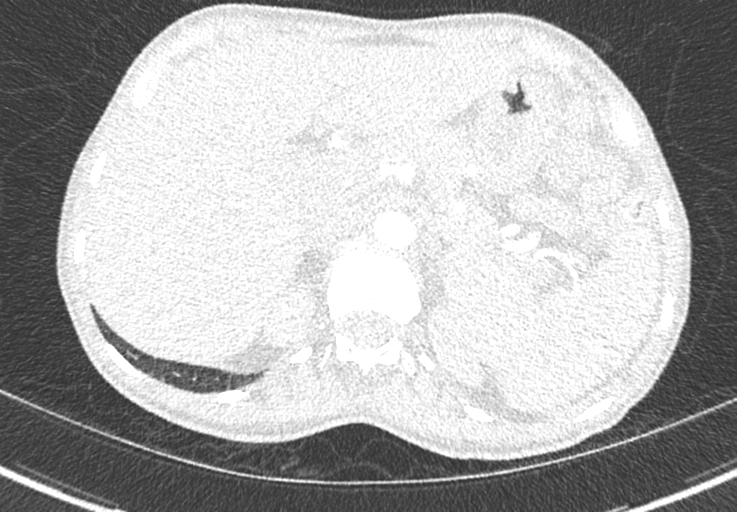
[im 40/198  mediastinal]
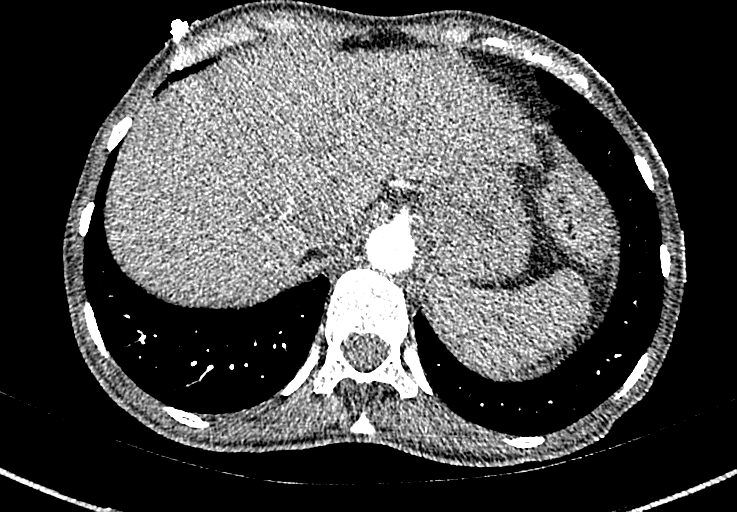
[im 60/198  lung]
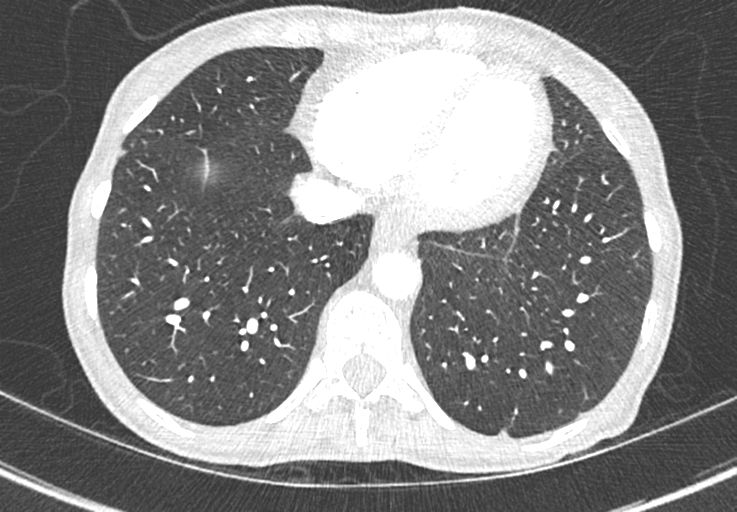
[im 79/198  mediastinal]
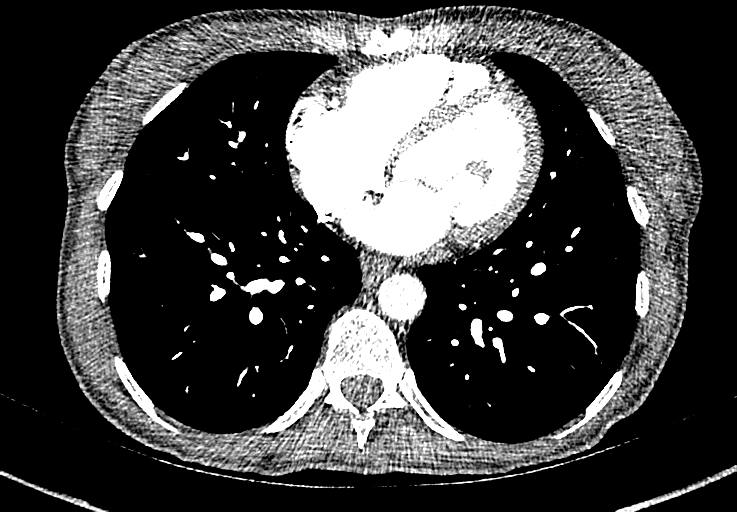
[im 99/198  lung]
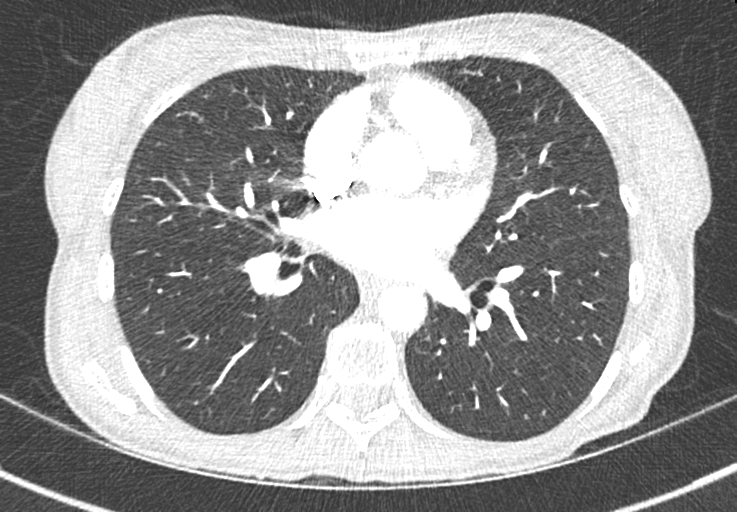
[im 119/198  mediastinal]
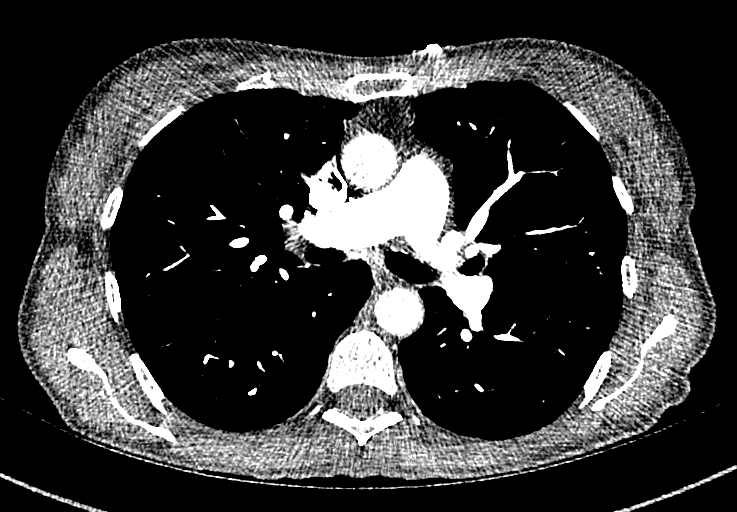
[im 138/198  lung]
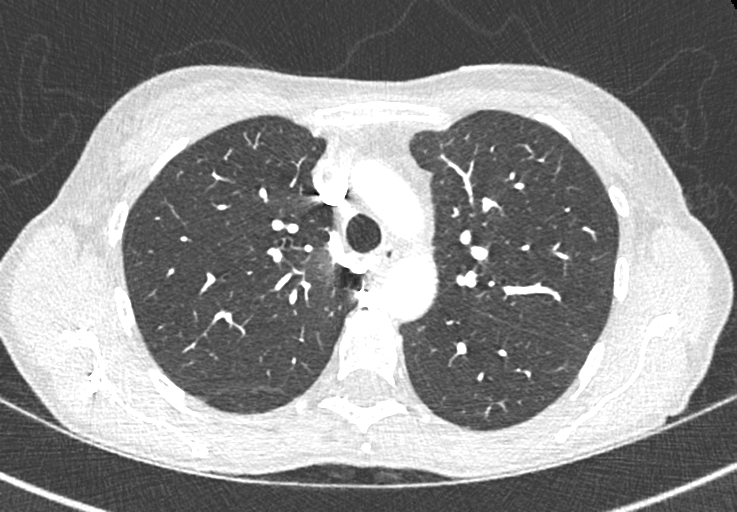
[im 158/198  mediastinal]
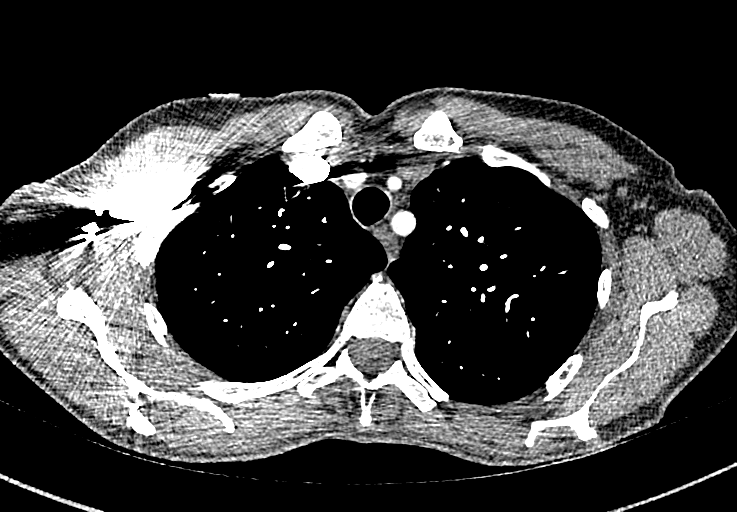
[im 178/198  lung]
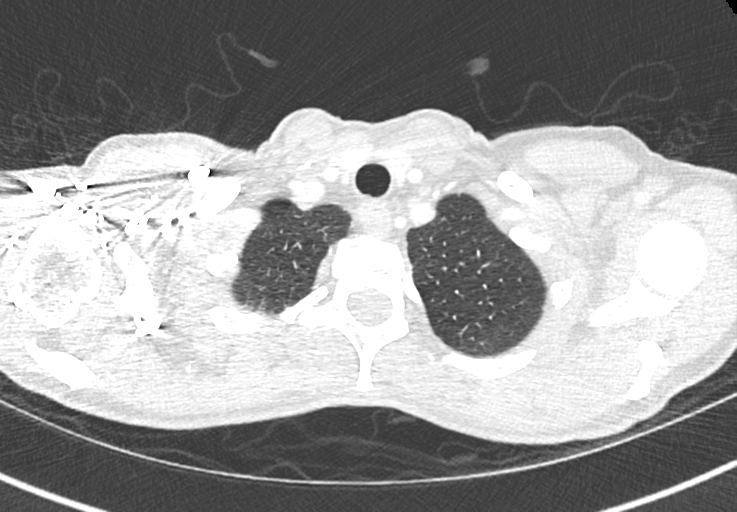

[14 of 36 positions shown; findings below may reference images not displayed]

EXAM

CT CHEST

INDICATION

Chest pain, elevated D-dimer

TECHNIQUE

CT of the chest was performed. 75 milliliters of Omnipaque 350

All CT scans at this facility use dose modulation, iterative reconstruction, and/or weight based
dosing when appropriate to reduce radiation dose to as low as reasonably achievable.

# of CT scans in the past year: 1 # of Myocardial perfusion scans this past year: 0

COMPARISONS

XR chest 04/25/2021

FINDINGS

Pulmonary arteries: No pulmonary arterial filling defect. Adequate study. Mildly elevated right
heart pressures.

Lower neck: [Unremarkable]

Lymphadenopathy: [No suspicious lymphadenopathy. Calcified subcarinal lymph nodes.]

Cardiovascular: [Normal cardiac size.] [No pericardial effusion.] [Normal caliber of the great
vessels.]

Upper abdomen: [Unremarkable]

Musculoskeletal: [No acute fracture or aggressive focal osseous lesion.]

Lung parenchyma/pleural space: No suspicious focal airspace opacity, nodule, or consolidation. [No
pleural effusion or pneumothorax.] Left lower lobe subpleural to pleural-based nodularity, which may
reflect atelectasis.

Central Airways: Patent

IMPRESSION
1. No CTA evidence of pulmonary embolism or coronary artery calcification.
2. No suspicious consolidation or pulmonary nodule. No pleural effusion.

Tech Notes:

PT STATES CHEST PAIN, ELEVATED D-DIMER. GFR 62.1. 75ML [id number]. CT/NM 1/0. TJ/AC

## 2021-07-24 IMAGING — CR [ID]
5 series · 5 of 5 positions shown · non-contrast
Comparison: none

[lspine ap]
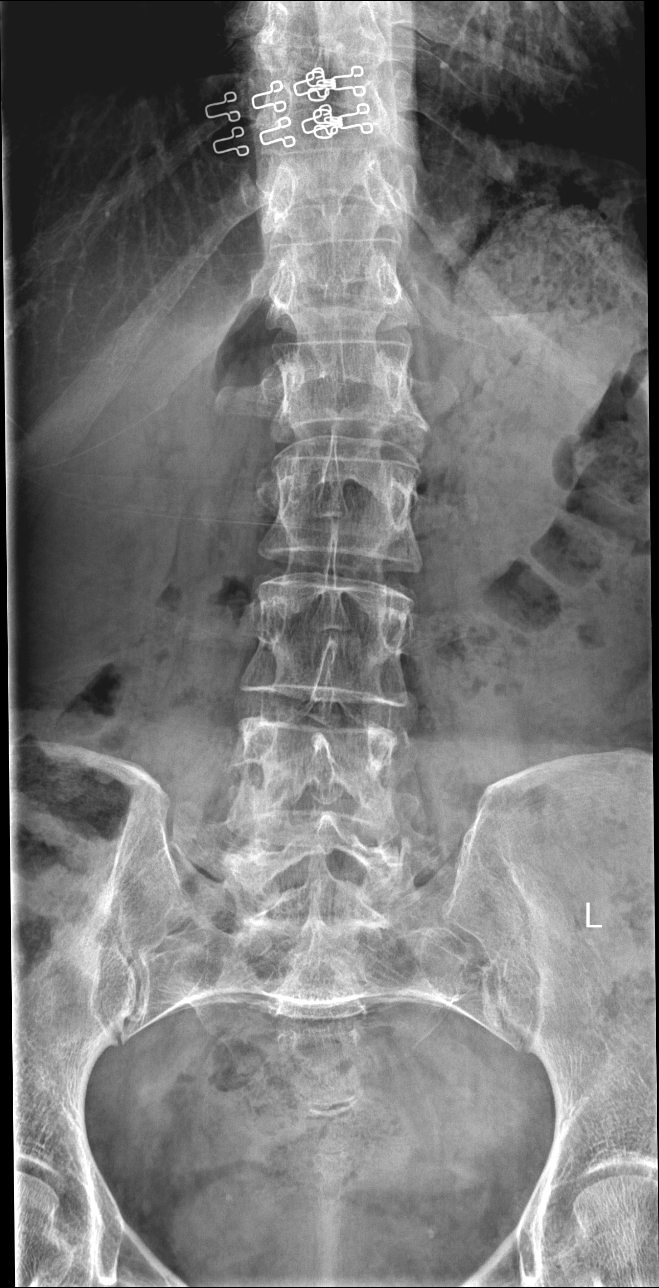

[lspine obl (1 of 2)]
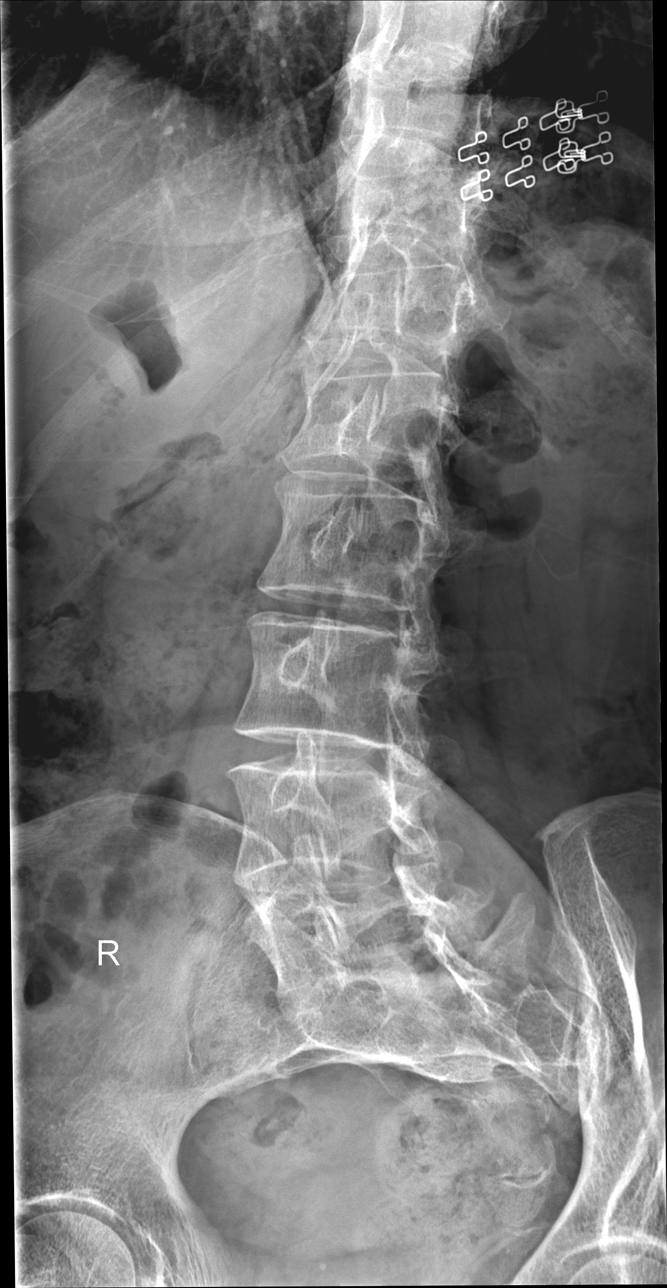

[lspine obl (2 of 2)]
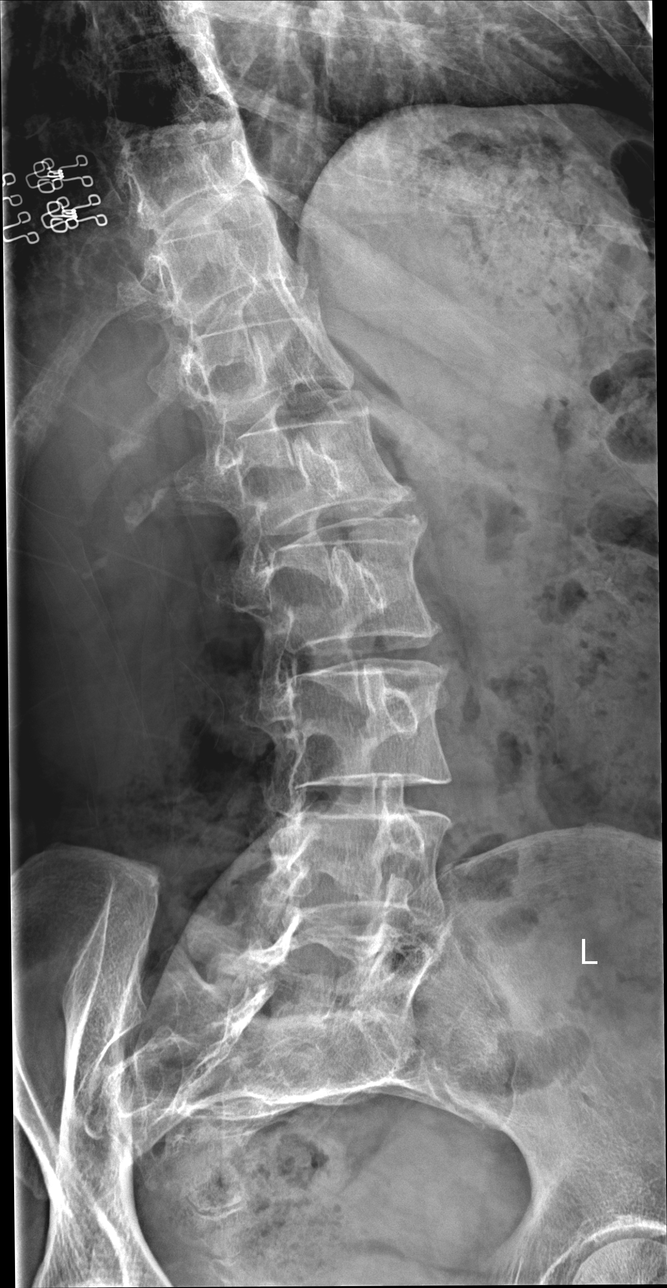

[lspine lat]
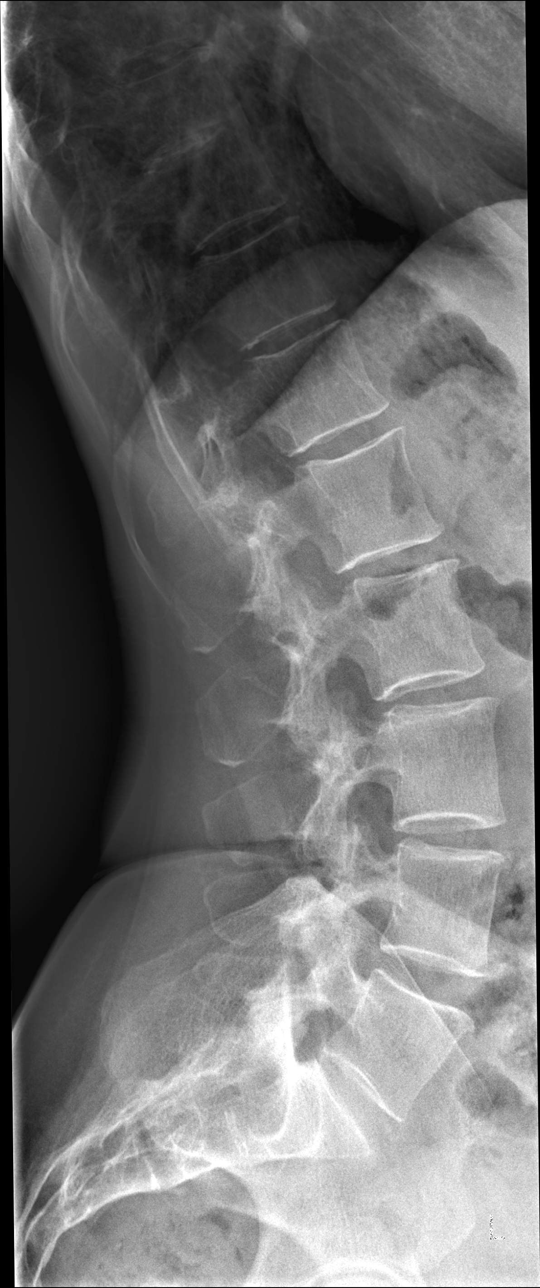

[lspine l5-s1]
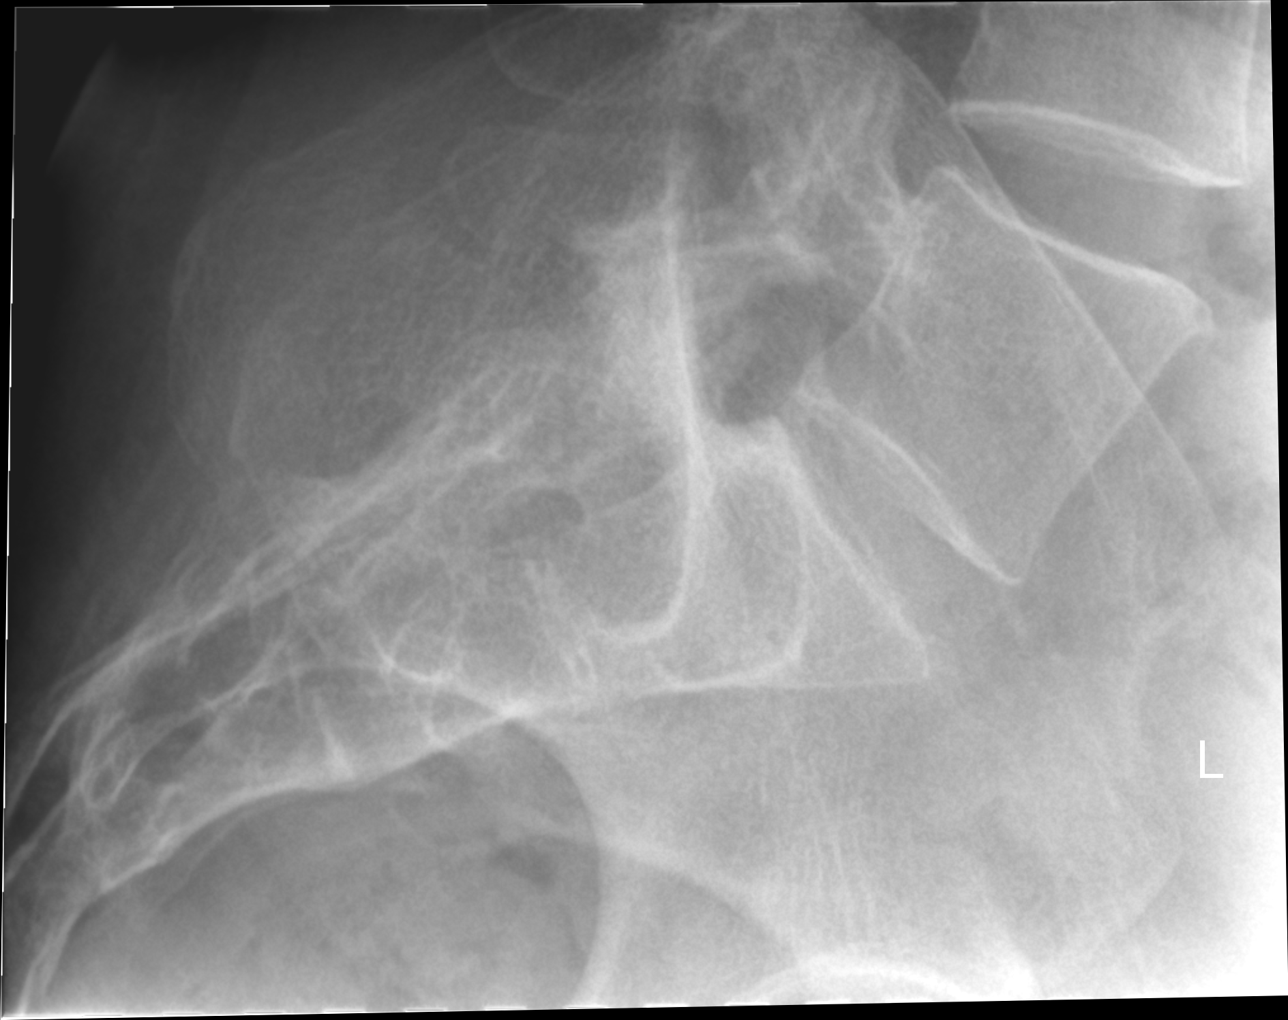

[5 of 5 positions shown; findings below may reference images not displayed]

DIAGNOSTIC STUDIES

EXAM

XR lumbar spine 5 view

INDICATION

low back pain radiating down right leg
Low back pain after a fall from a step ladder 1 week ago. CS

TECHNIQUE

AP lateral both oblique and spot films

COMPARISONS

October 14, 2018

FINDINGS

Slight curvature of the lumbar spine to the left is seen likely due to positioning or spasm. There
is minimal facet hypertrophy throughout the lumbar spine. No compression fractures are seen. Slight
disc space narrowing is noted at L1-2, L2-3, and L3-4. The L4-5 and L5-S1 discs are generally well
preserved.

IMPRESSION

Mild degenerative changes as described above in slight curvature of the lumbar spine to the left.
No compression fractures are seen.

Tech Notes:

Low back pain after a fall from a step ladder 1 week ago. CS

## 2021-08-17 IMAGING — MR L-spine^Routine
5 series · 34 of 48 positions shown · non-contrast
Comparison: none

[Series 2: T2 · sagittal · 4.0mm · 0.62mm/px · 6 of 13 slices shown (1 of 2)]
[im 1/13]
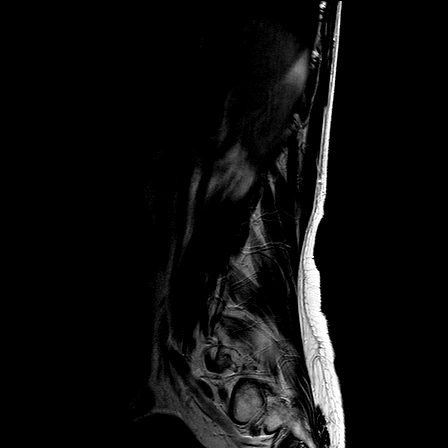
[im 3/13]
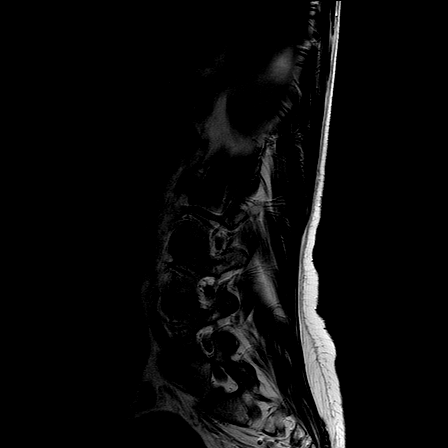
[im 5/13]
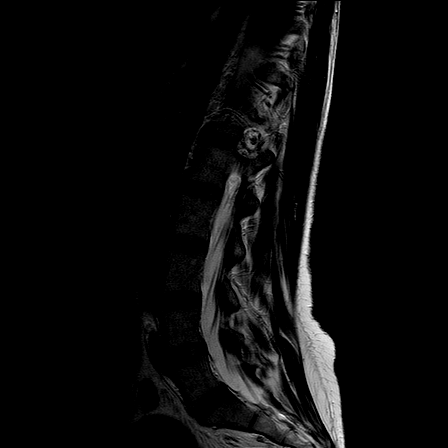
[im 8/13]
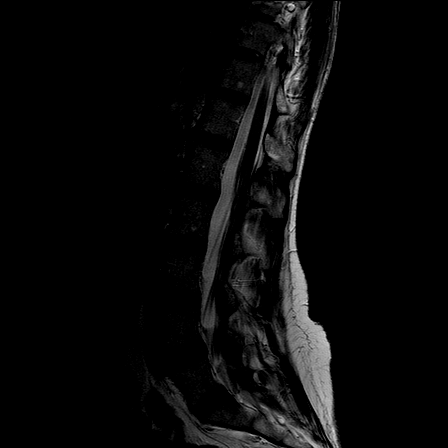
[im 10/13]
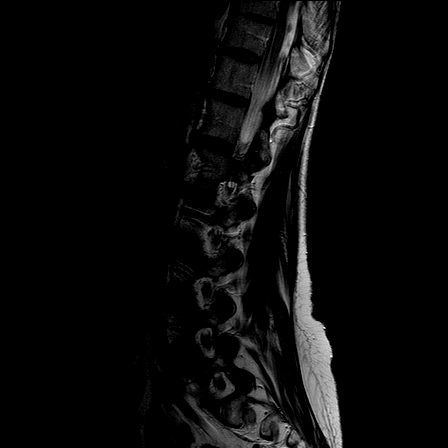
[im 13/13]
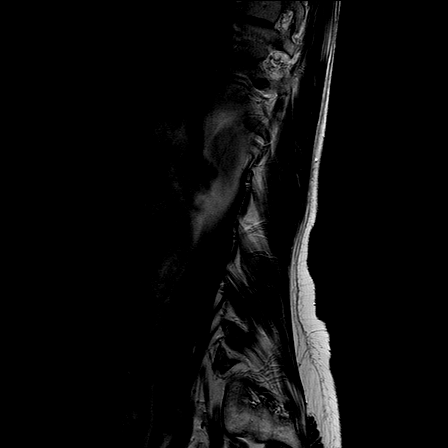

[Series 3: T1 · sagittal · 4.0mm · 0.73mm/px · 7 of 13 slices shown (1 of 2)]
[im 1/13]
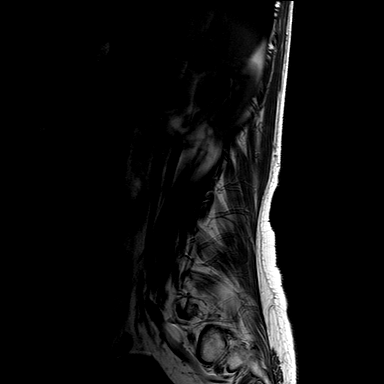
[im 3/13]
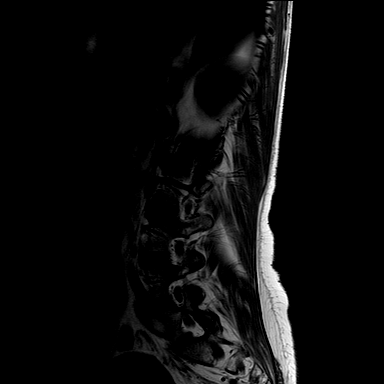
[im 5/13]
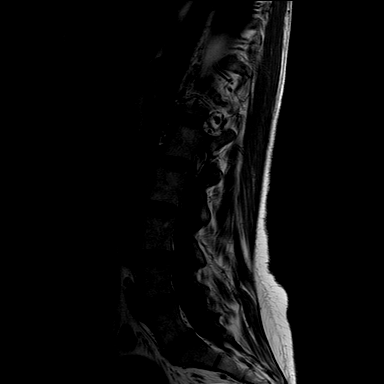
[im 7/13]
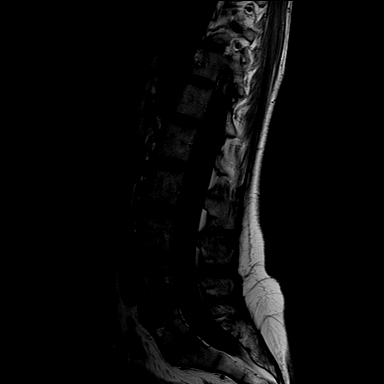
[im 9/13]
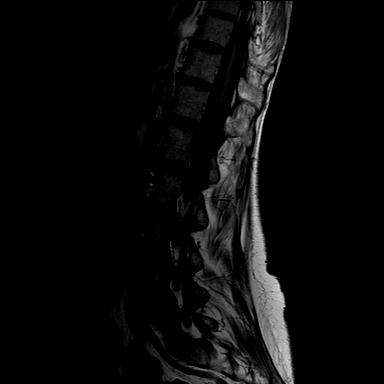
[im 11/13]
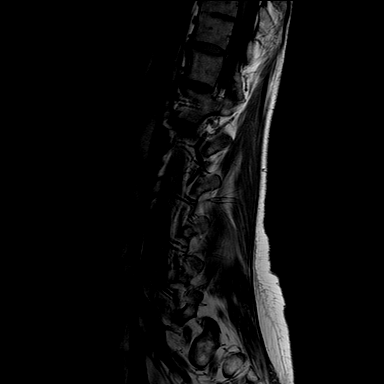
[im 13/13]
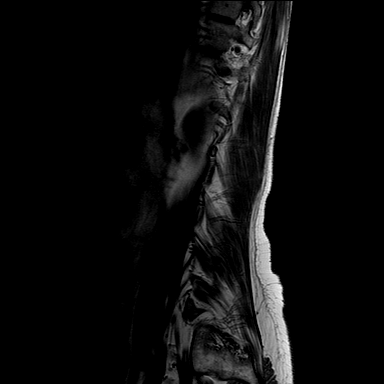

[Series 4: STIR · sagittal · 4.0mm · 0.55mm/px · 5 of 13 slices shown]
[im 1/13]
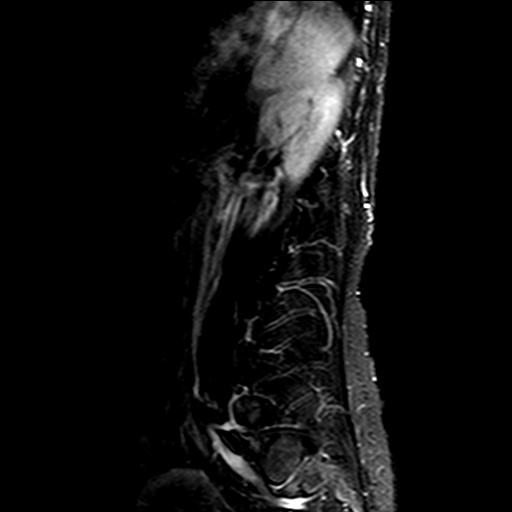
[im 3/13]
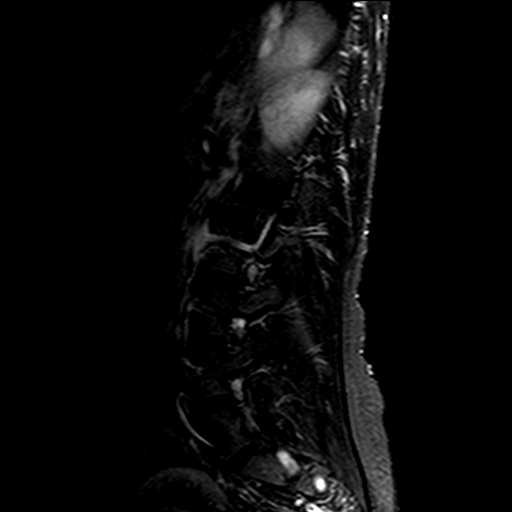
[im 5/13]
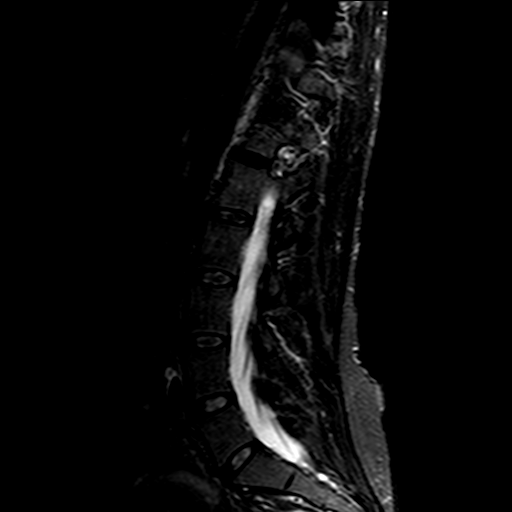
[im 7/13]
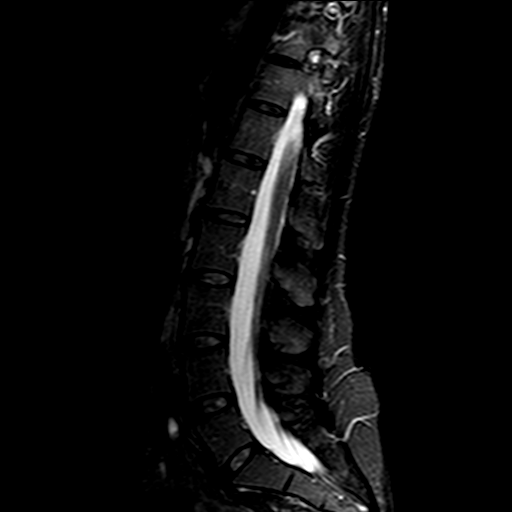
[im 9/13]
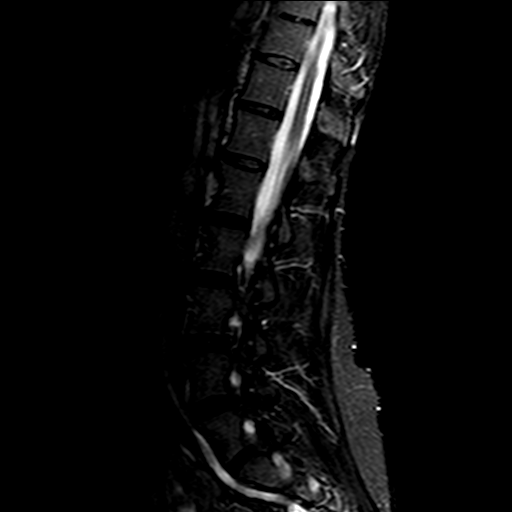

[Series 5: T2 · axial · 4.5mm · 0.49mm/px · z∈[-157,+23]mm · 8 of 26 slices shown (2 of 2)]
[im 1/26]
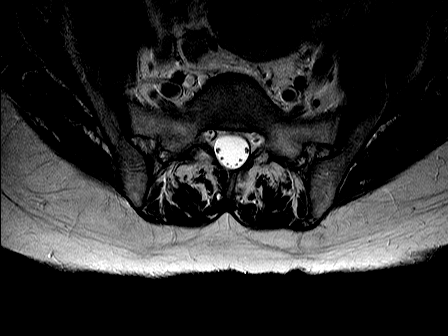
[im 4/26]
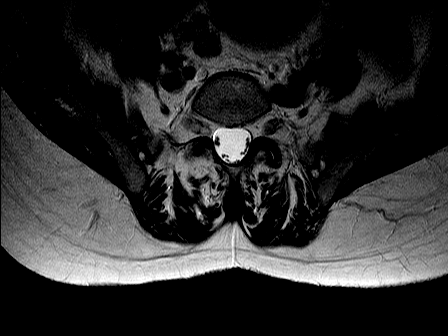
[im 8/26]
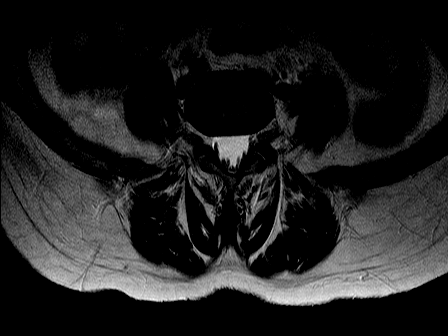
[im 12/26]
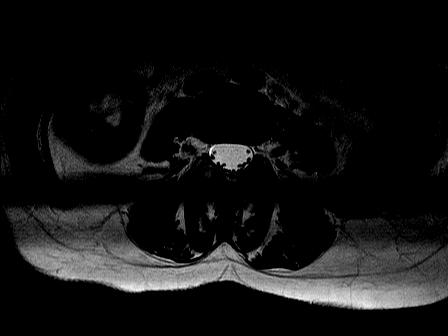
[im 14/26]
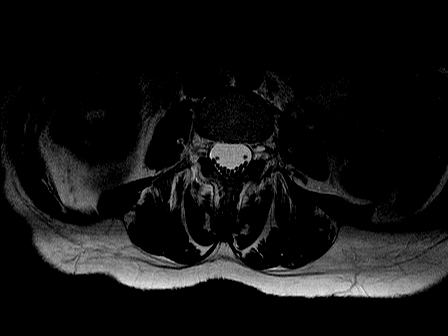
[im 18/26]
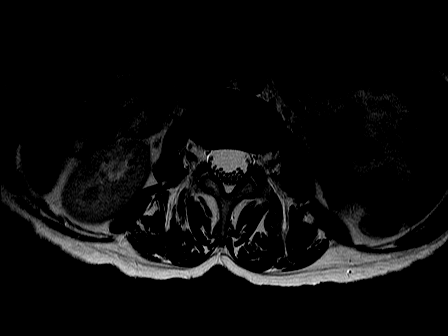
[im 22/26]
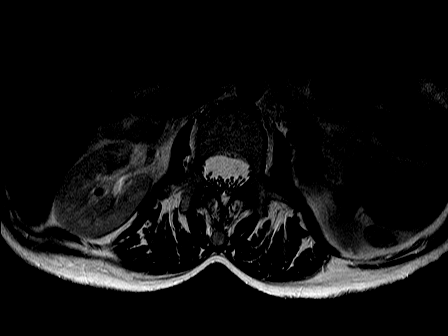
[im 26/26]
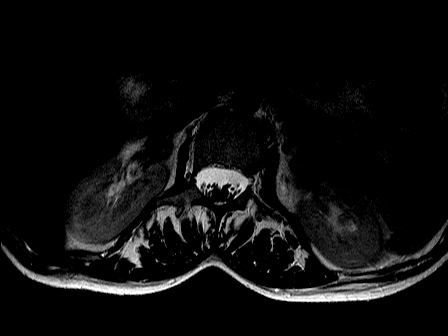

[Series 6: T1 · axial · 4.5mm · 0.86mm/px · z∈[-157,+23]mm · 8 of 26 slices shown (2 of 2)]
[im 1/26]
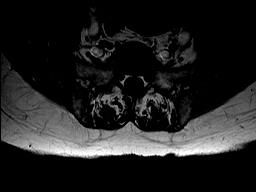
[im 4/26]
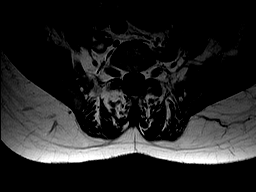
[im 8/26]
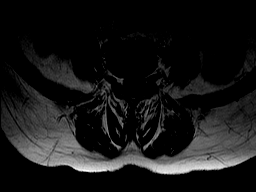
[im 12/26]
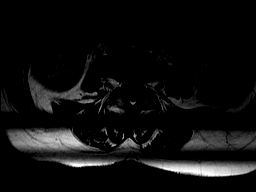
[im 14/26]
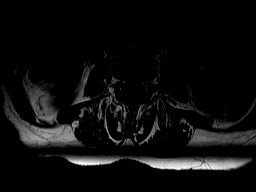
[im 18/26]
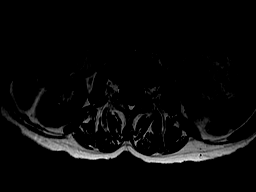
[im 22/26]
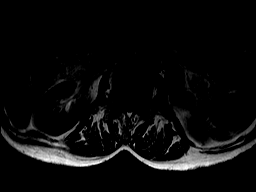
[im 26/26]
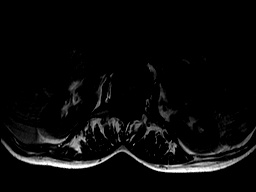

[34 of 48 positions shown; findings below may reference images not displayed]

EXAM

MR lumbar spine wo con

INDICATION

back pain with left sciatica
CHRONIC LOW BACK PAIN, INCREASING OVER CHRISTMAS, WITH PAIN SHOOTING DOWN RT LEG.  RG

TECHNIQUE

Multiplanar multisequence MRI of the lumbar spine without IV contrast

COMPARISONS

Radiographs July 24, 2021, CT June 06, 2019

FINDINGS

The conus medullaris tip terminates at L2. Normal cord and marrow signal. The intervertebral disc
heights are maintained.

L1-L2: Patent central canal and neural foramina.

L2-L3: Patent central canal and neural foramina.

L3-L4: Patent surgery canal and neural foramina.

L4-L5: Patent central canal and neural foramina.

L5-S1: Patent central canal and neural foramina.

Prominence of the partially imaged uterus, not significantly changed from June 06, 2019.
Heterogeneous fatty contents in the colon.

IMPRESSION

No central canal or neural foraminal stenosis. The intervertebral disc heights are maintained.

Tech Notes:

CHRONIC LOW BACK PAIN, INCREASING OVER CHRISTMAS, WITH PAIN SHOOTING DOWN RT LEG.  RG

## 2021-08-24 IMAGING — CR [ID]
5 series · 5 of 5 positions shown · non-contrast
Comparison: No relevant prior studies available.

DIAGNOSTIC STUDIES

EXAM:  XR CERVICAL SPINE, 4 OR 5 VIEWS  ([id number])
INDICATION: neck pain. pt c/o neck pain x1 day. denies sx hx. Ak
TECHNIQUE: 4 or 5 views of the cervical spine.

[cspine lat]
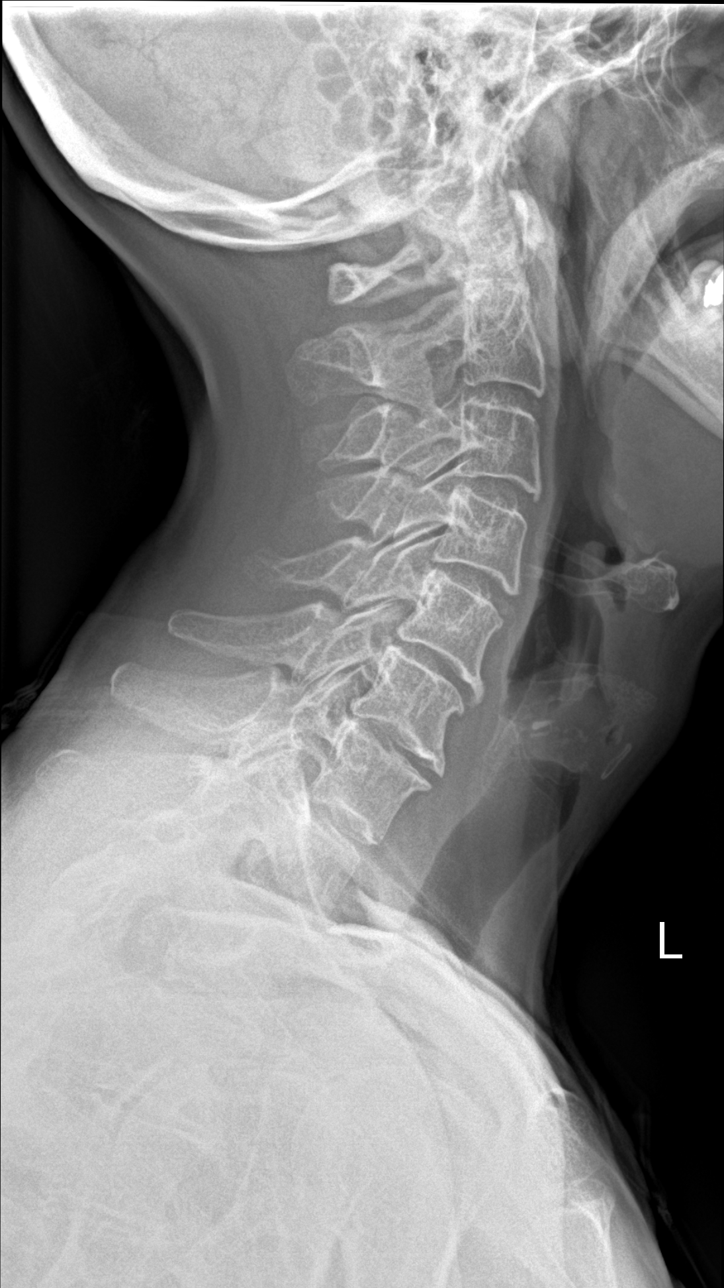

[cspine obl (1 of 2)]
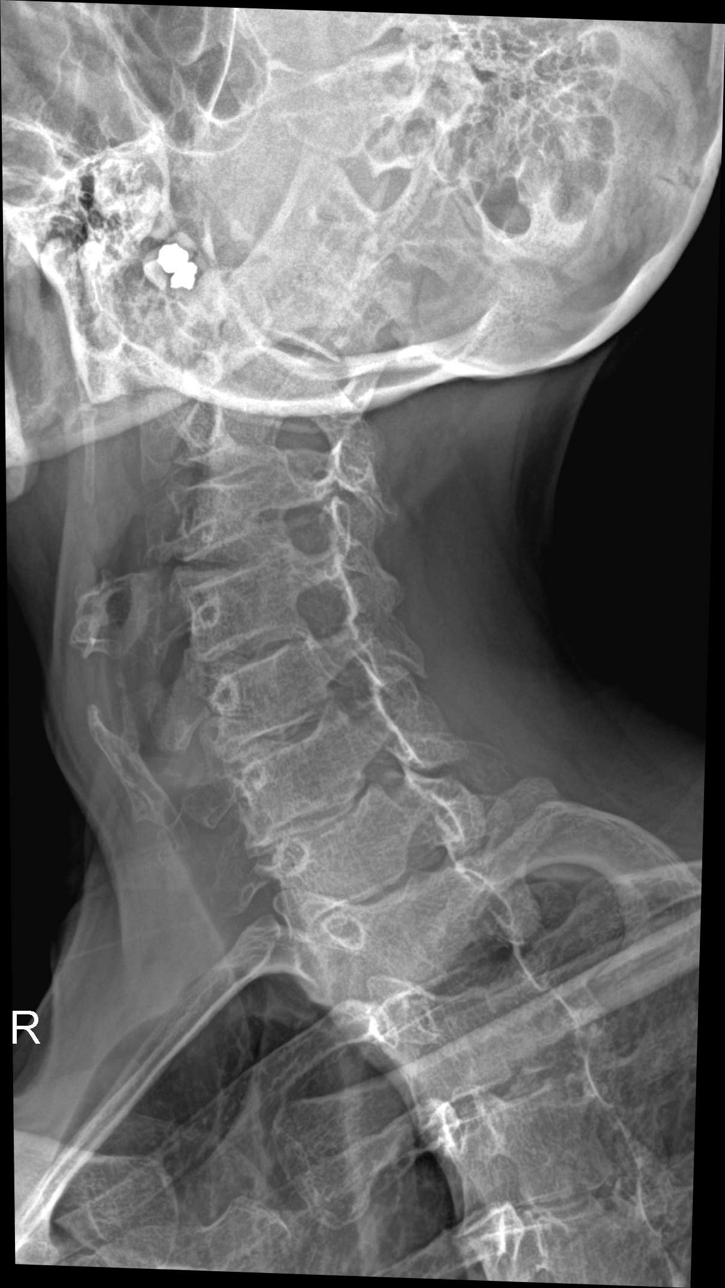

[cspine obl (2 of 2)]
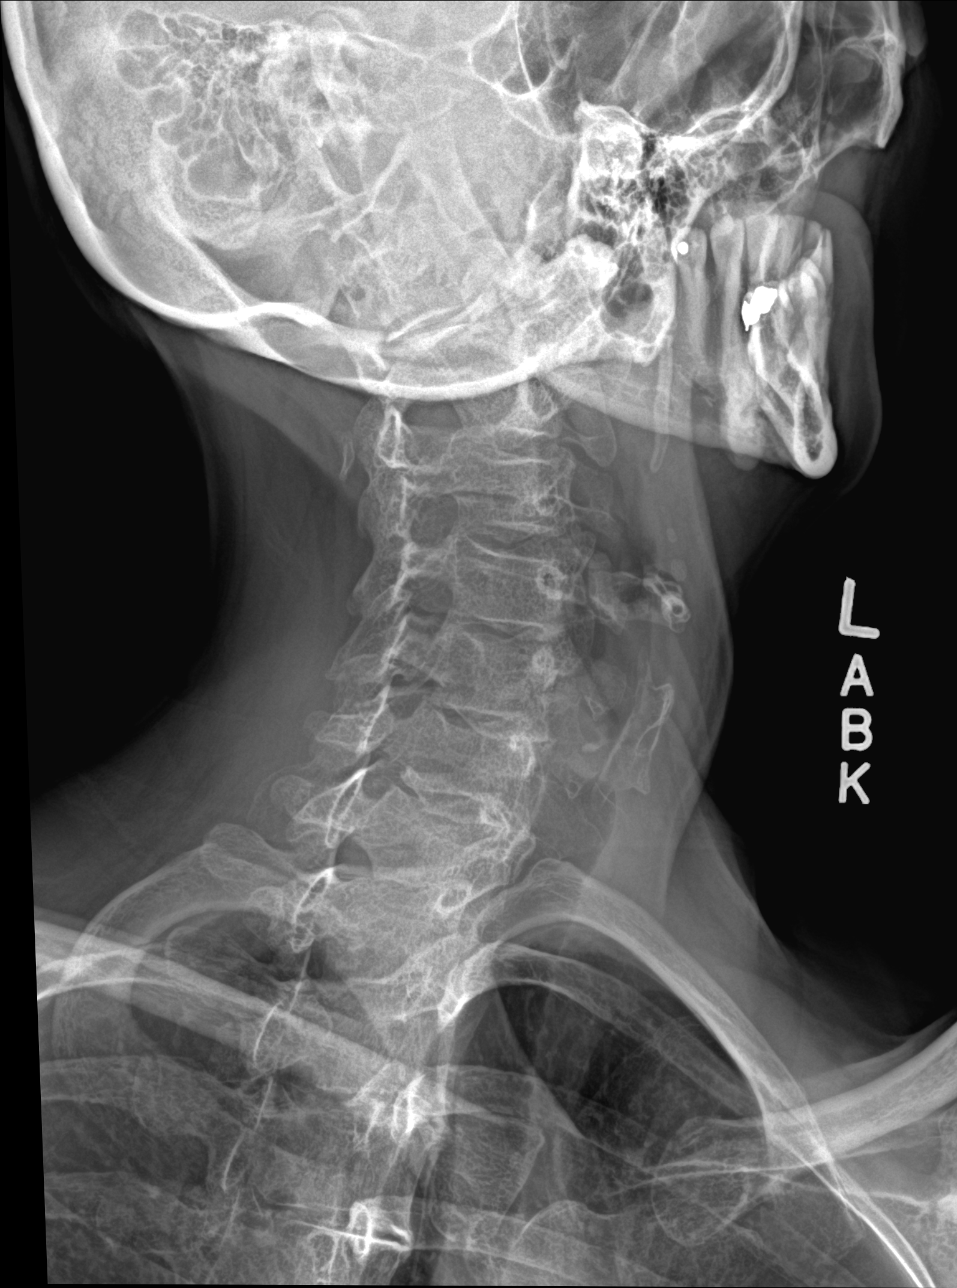

[cspine ap]
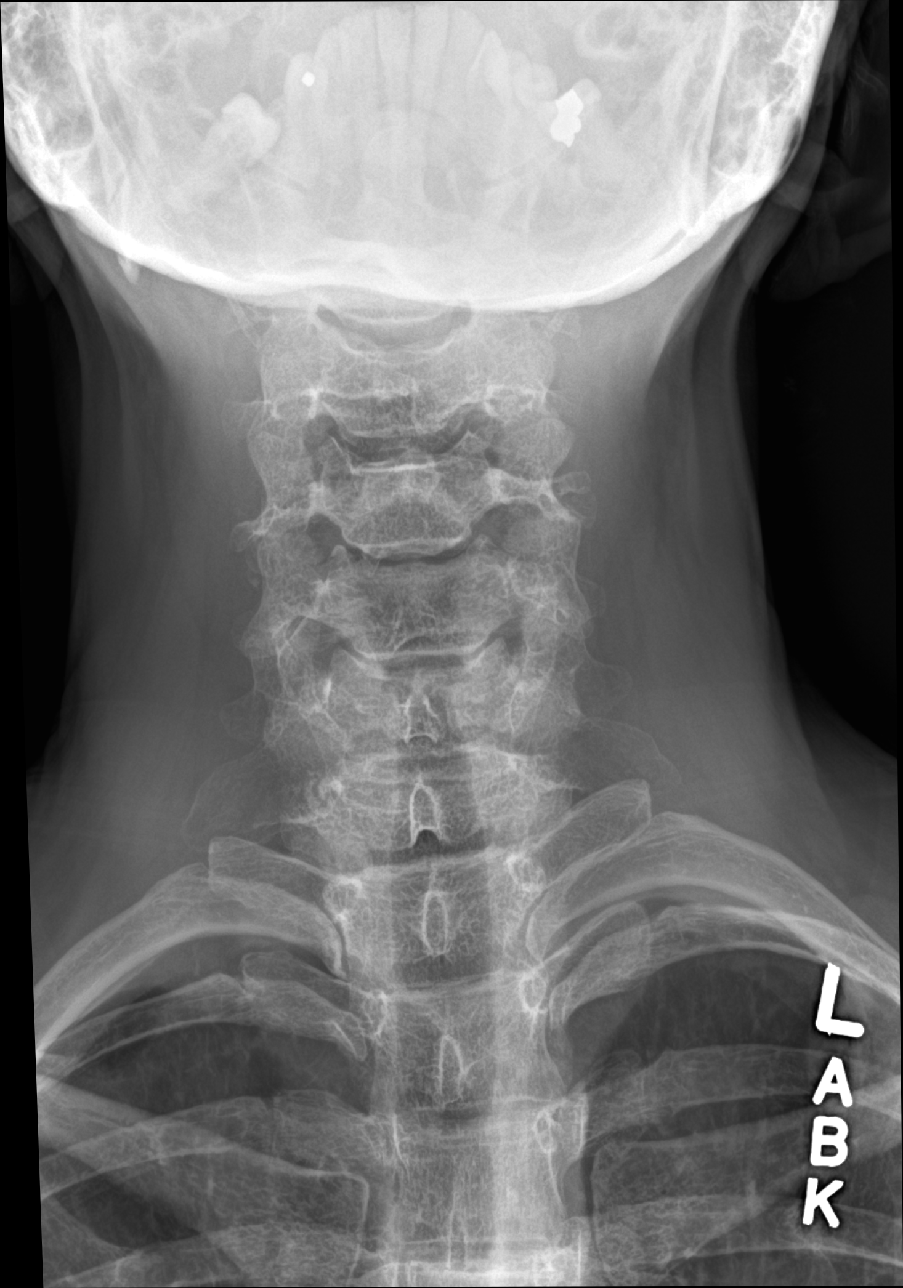

[cspine odontoid]
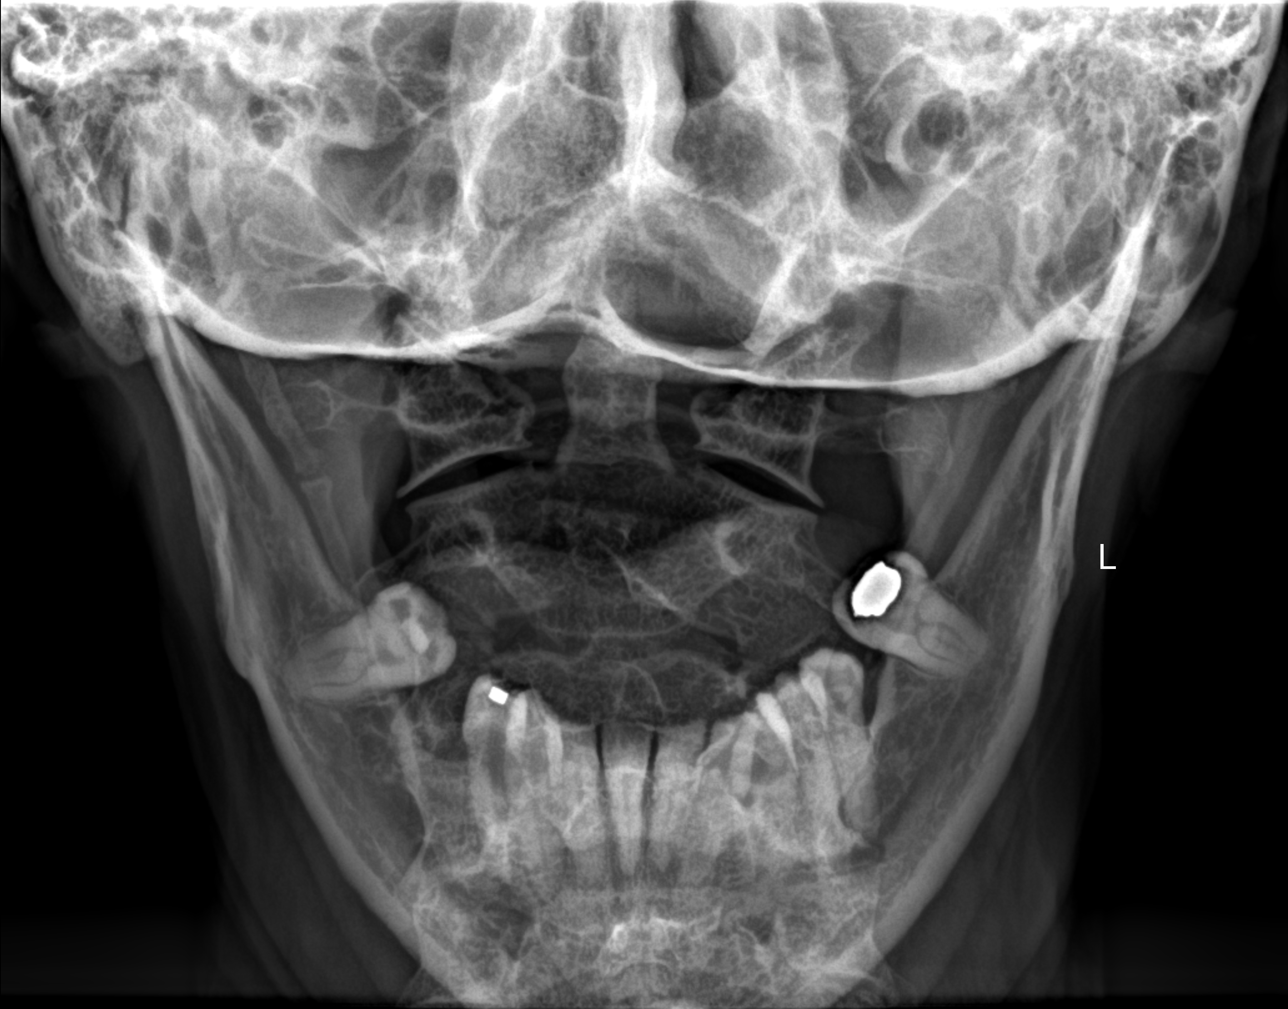

[5 of 5 positions shown; findings below may reference images not displayed]

FINDINGS: VERTEBRAE:  NO definite acute compression fractures.

Minimal endplate osteophytes and degenerative changes.

Minimally degenerative changes in the facets.

Minimal osteopenia of the visualized bony structures.

NO definite acute compression fractures.

DISC SPACES:  Minimal multilevel disc degeneration with loss of height this is most prominent
inferiorly.

SOFT TISSUES:  NO radiopaque foreign body.
IMPRESSION: 1.  NO definite acute compression fractures.

2.  Spondylosis with degenerative spinal changes as described.

Tech Notes:

pt c/o neck pain x1 day. denies sx hx. Ak

## 2022-01-17 ENCOUNTER — Encounter: Admit: 2022-01-17 | Discharge: 2022-01-17 | Payer: MEDICAID

## 2022-01-23 ENCOUNTER — Ambulatory Visit: Admit: 2022-01-23 | Discharge: 2022-01-23 | Payer: MEDICAID

## 2022-01-23 ENCOUNTER — Encounter: Admit: 2022-01-23 | Discharge: 2022-01-23 | Payer: MEDICAID

## 2022-01-23 DIAGNOSIS — I340 Nonrheumatic mitral (valve) insufficiency: Secondary | ICD-10-CM

## 2022-01-23 DIAGNOSIS — R079 Chest pain, unspecified: Secondary | ICD-10-CM

## 2022-01-23 IMAGING — US ECHOCOMPL
1 series · 12 of 24 positions shown · non-contrast
Comparison: none

[Series 1: us echo 2d, complete · 84 acquisitions, 12 frames shown]
[im 4/84]
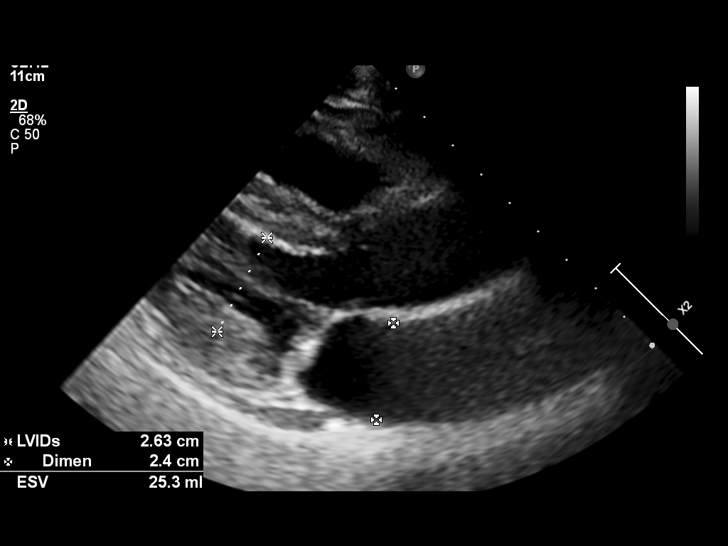
[im 11/84]
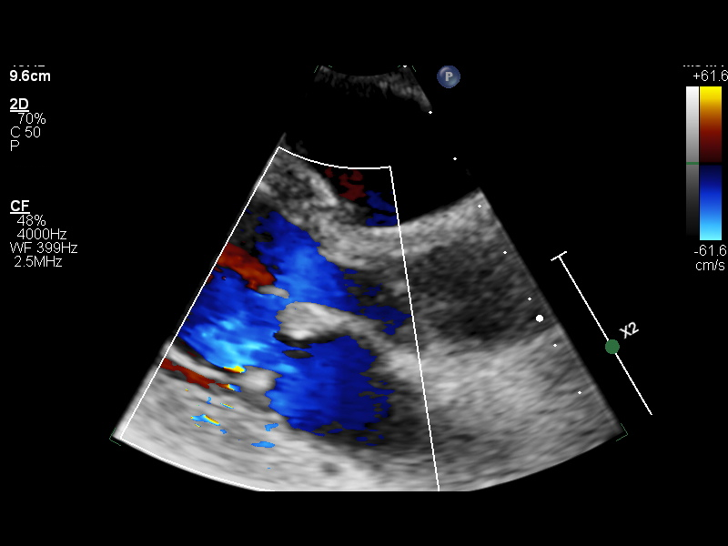
[im 19/84]
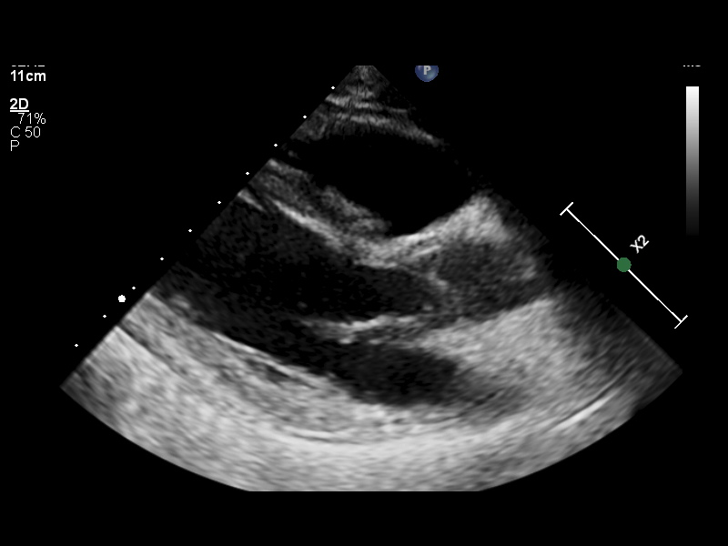
[im 22/84]
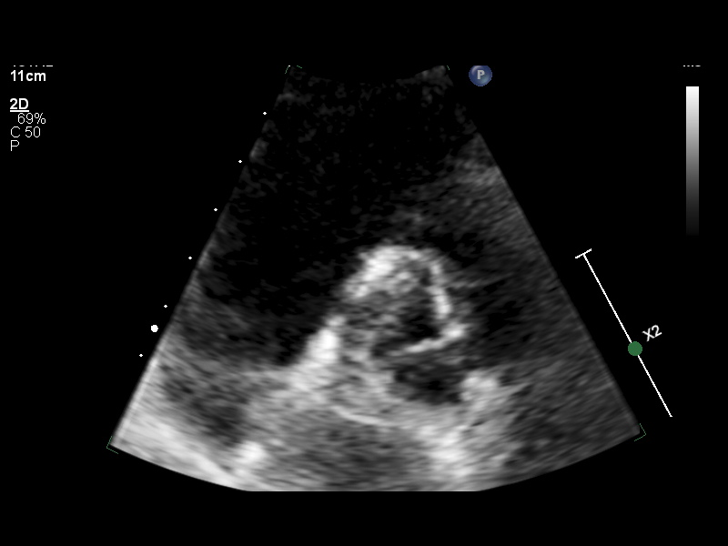
[im 33/84]
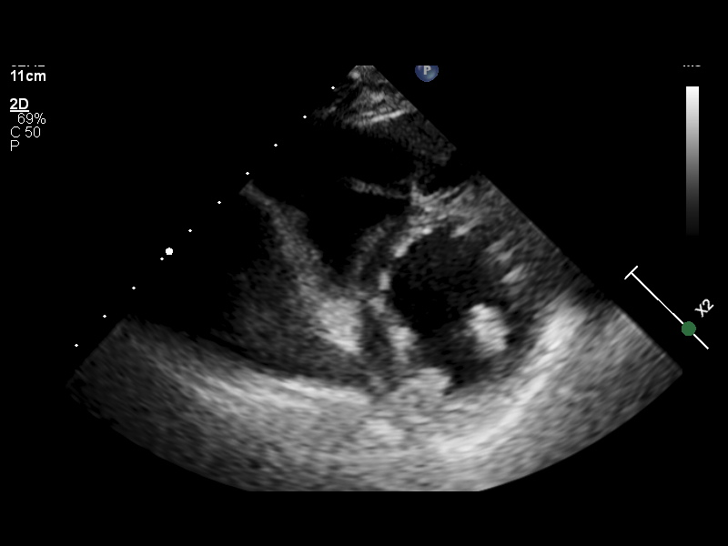
[im 37/84]
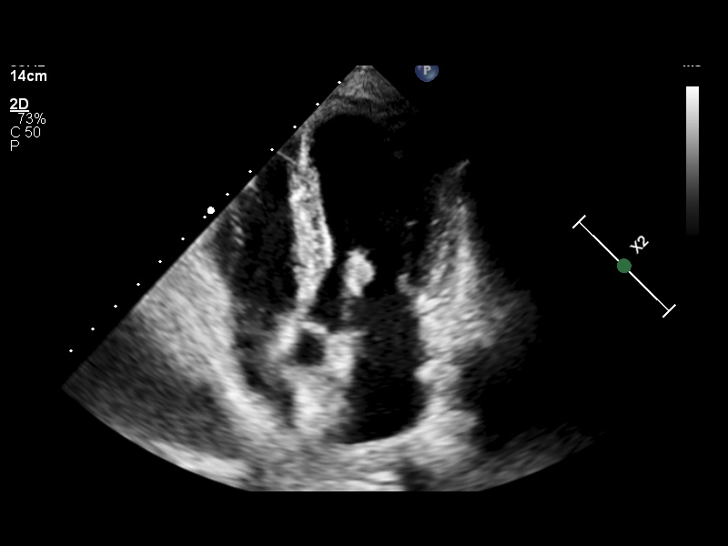
[im 47/84]
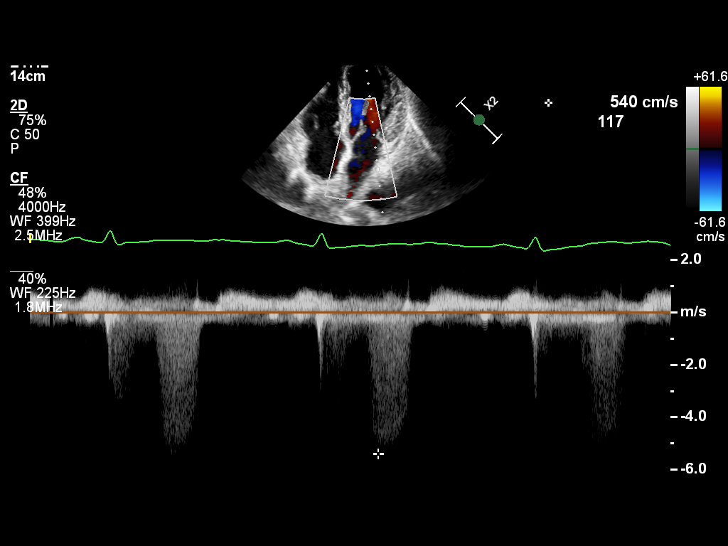
[im 55/84]
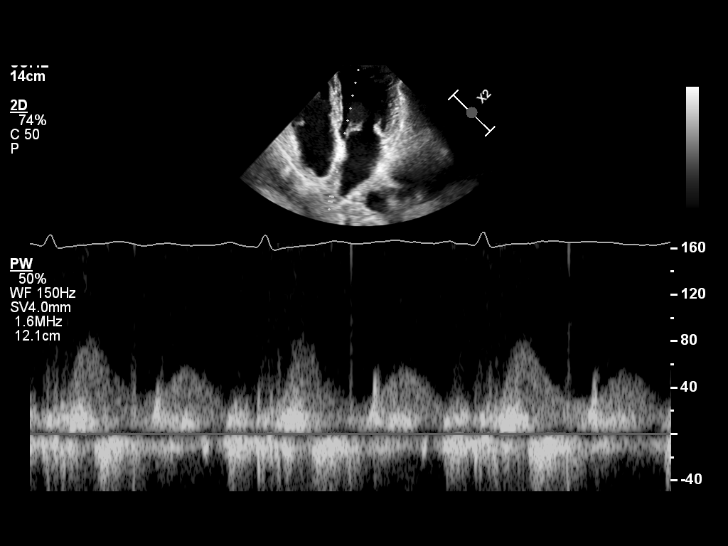
[im 62/84]
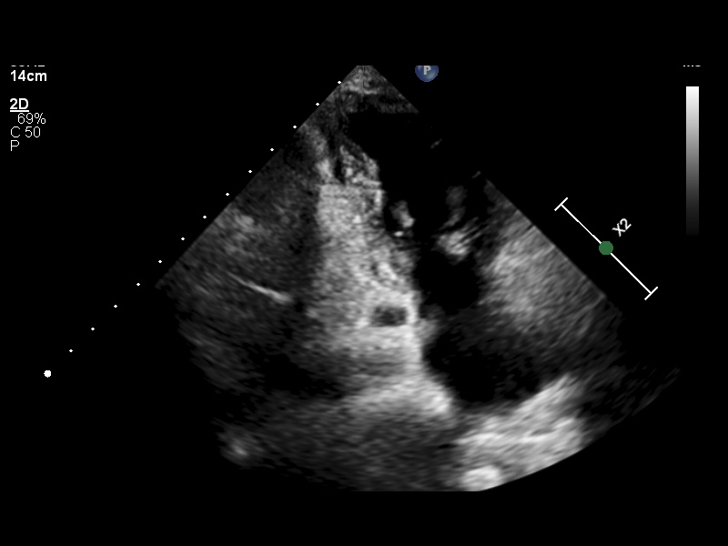
[im 69/84]
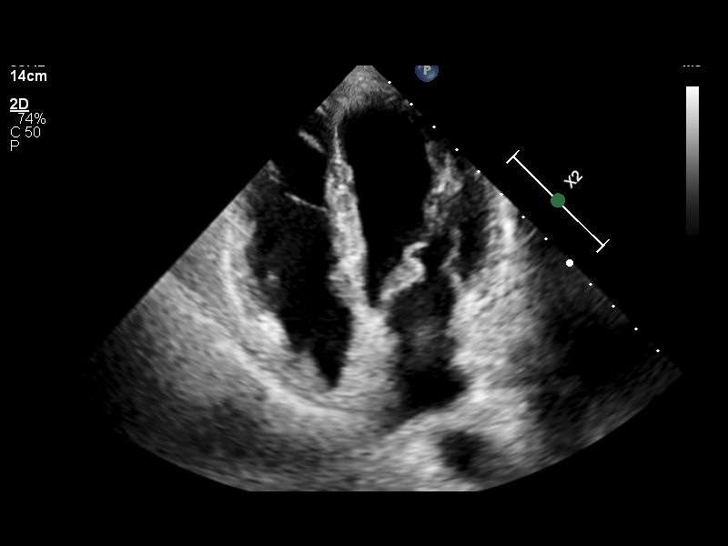
[im 76/84]
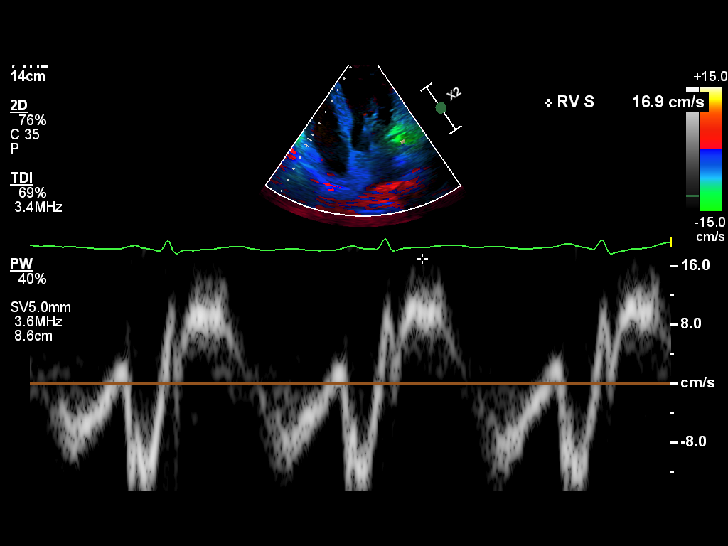
[im 84/84]
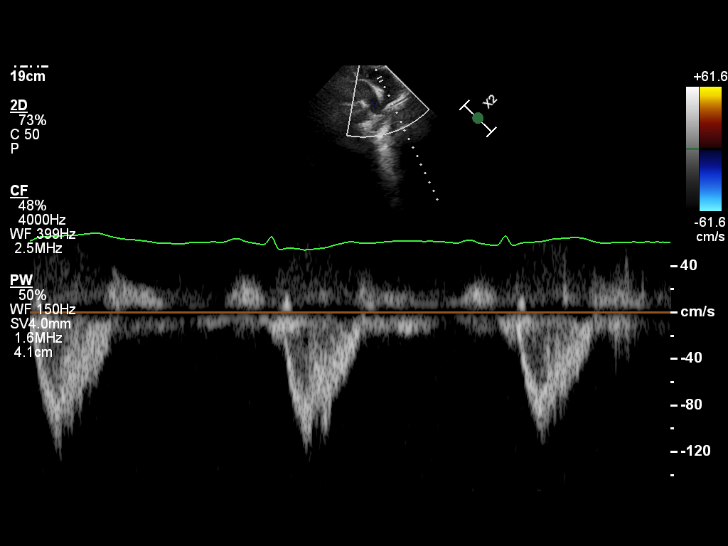

[12 of 24 positions shown; findings below may reference images not displayed]

01/23/22 -  2D + DOPPLER ECHO
Location Performed: [HOSPITAL]

Referring Provider:
Fellow:
Location of Interp:
Sonographer: External Staff
Machine:  [employer] 70[employer]

Indications: Chest pain     Mitral regurgitation

Vitals
Height   Weight   BSA (Calculated)   BP   Comments
144.8 cm (4' 9")   40.8 kg (90 lb)   1.28   108/62

Interpretation Summary
The left ventricular systolic function is normal. The visually estimated ejection fraction is 65%.
There are no segmental wall motion abnormalities.
Normal left ventricular diastolic function. Normal left atrial pressure.
The right ventricular size, wall thickness and systolic function are normal.
Nonspecific mitral valve thickening, myxomatous appearance, mild regurgitation.
Mild tricuspid valve regurgitation.
Estimated Peak Systolic PA Pressure 35 mmHg

Echocardiographic Findings
Left Ventricle   The left ventricular size is normal. The left ventricular wall thickness is normal.
Concentric remodeling. The left ventricular systolic function is normal. The visually estimated
ejection fraction is 65%. There are no segmental wall motion abnormalities. Normal left ventricular
diastolic function. Normal left atrial pressure.
Right Ventricle   The right ventricular size, wall thickness and systolic function are normal.
Left Atrium   Normal size.
Right Atrium   Normal size.
IVC/SVC   Elevated central venous pressure (5-10 mm Hg).
Mitral Valve   Non-specific thickening and leaflets appear myxomatous. No stenosis. Mild
regurgitation.
Tricuspid Valve   Normal valve structure. No stenosis. Mild regurgitation.
Aortic Valve   Normal valve structure. No stenosis. No regurgitation.
Pulmonary   The pulmonic valve was not seen well but no Doppler evidence of stenosis. Trace
regurgitation.
Aorta   The aortic root and ascending aorta are normal in size.
Pericardium   No pericardial effusion.

Left Ventricular Wall Scoring
Resting   Score Index: 1.000   Percent Normal: 100.0%

The left ventricular wall motion is normal.

Left Heart 2D Measurements (Normal Ranges)
EF (Visual)
65 %
LVIDD
3.9 cm  (Range: 3.8 - 5.2)
LVIDS
2.6 cm  (Range: 2.2 - 3.5)
IVS
0.8 cm  (Range: 0.6 - 0.9)
LV PW
0.9 cm  (Range: 0.6 - 0.9)
LA Size
2.4 cm  (Range: 2.7 - 3.8)

Right Heart 2D   M-Mode Measurements (Normal Ranges) (Range)
RV Basal Dia
2.5 cm  (2.5 - 4.1)
RV Mid Dia
1.6 cm  (1.9 - 3.5)
JHON ARMANDO
8.6 cm2  (<18)
M-Mode TAPSE
2.1 cm  (>1.7)

Left Heart 2D Addnl Measurements (Normal Ranges)
LV Systolic Vol
17 mL  (Range: 14 - 42)
LV Systolic Vol Index
13 mL/m2  (Range: 8 - 24)
LV Diastolic Vol
74 mL  (Range: 46 - 106)
LV Diastolic Vol Index
58 mL/m2  (Range: 29 - 61)
LA Vol
30 mL  (Range: 22 - 52)
LA Vol Index
23.44 mL/m2  (Range: 16 - 34)
LV Mass
97 g  (Range: 67 - 162)
LV Mass Index
76 g/m2  (Range: 43 - 95)
RWT
0.46  (Range: <=0.42)

Aortic Root Measurements (Normal Ranges)
Sinus
2.5 cm  (Range: 2.4 - 3.6)
AO Prox
2.3 cm  (Range: 1.9 - 3.5)

Doppler (Spectral and Color Flow)
Estimated Peak Systolic PA Pressure
Aortic valve peak velocity
1.5 m/s
Aortic valve velocity ratio

Tech Notes:

tm

## 2022-01-30 ENCOUNTER — Encounter: Admit: 2022-01-30 | Discharge: 2022-01-30 | Payer: MEDICAID

## 2022-01-30 NOTE — Telephone Encounter
Pt cancelled appt with [facility] due to illness.  He reviewed her echocardiogram and states that it is normal. Okay to follow up in October as scheduled.    Have tried to reach patient multiple times.  No answer, voicemail full.  Pt is not active on mychart.

## 2022-01-30 NOTE — Telephone Encounter
Called and discussed results with patient.  No questions at this time.  Pt will callback with any questions, concerns or problems.

## 2022-01-30 NOTE — Telephone Encounter
Patient returned call. Discussed echo results with patient and verified D/T/L of next scheduled appointment. Patient does not have any further questions or concerns at this time.

## 2022-04-05 IMAGING — CR XR forearm LT 2V
2 series · 2 of 2 positions shown · non-contrast
Comparison: No relevant prior studies available.

DIAGNOSTIC STUDIES

EXAM:  XR LEFT FOREARM, 2 VIEWS  ([id number])
INDICATION: dog bite pt c/o dog bite. 2 superficial puncture wounds on the dorsal aspect of the
left midforearm with a 1 cm area of ecchymosis on the volar aspect mid forearm. hx of wrist
deformity. AK
TECHNIQUE: 2 views of the left forearm.

[x forearm ap left]
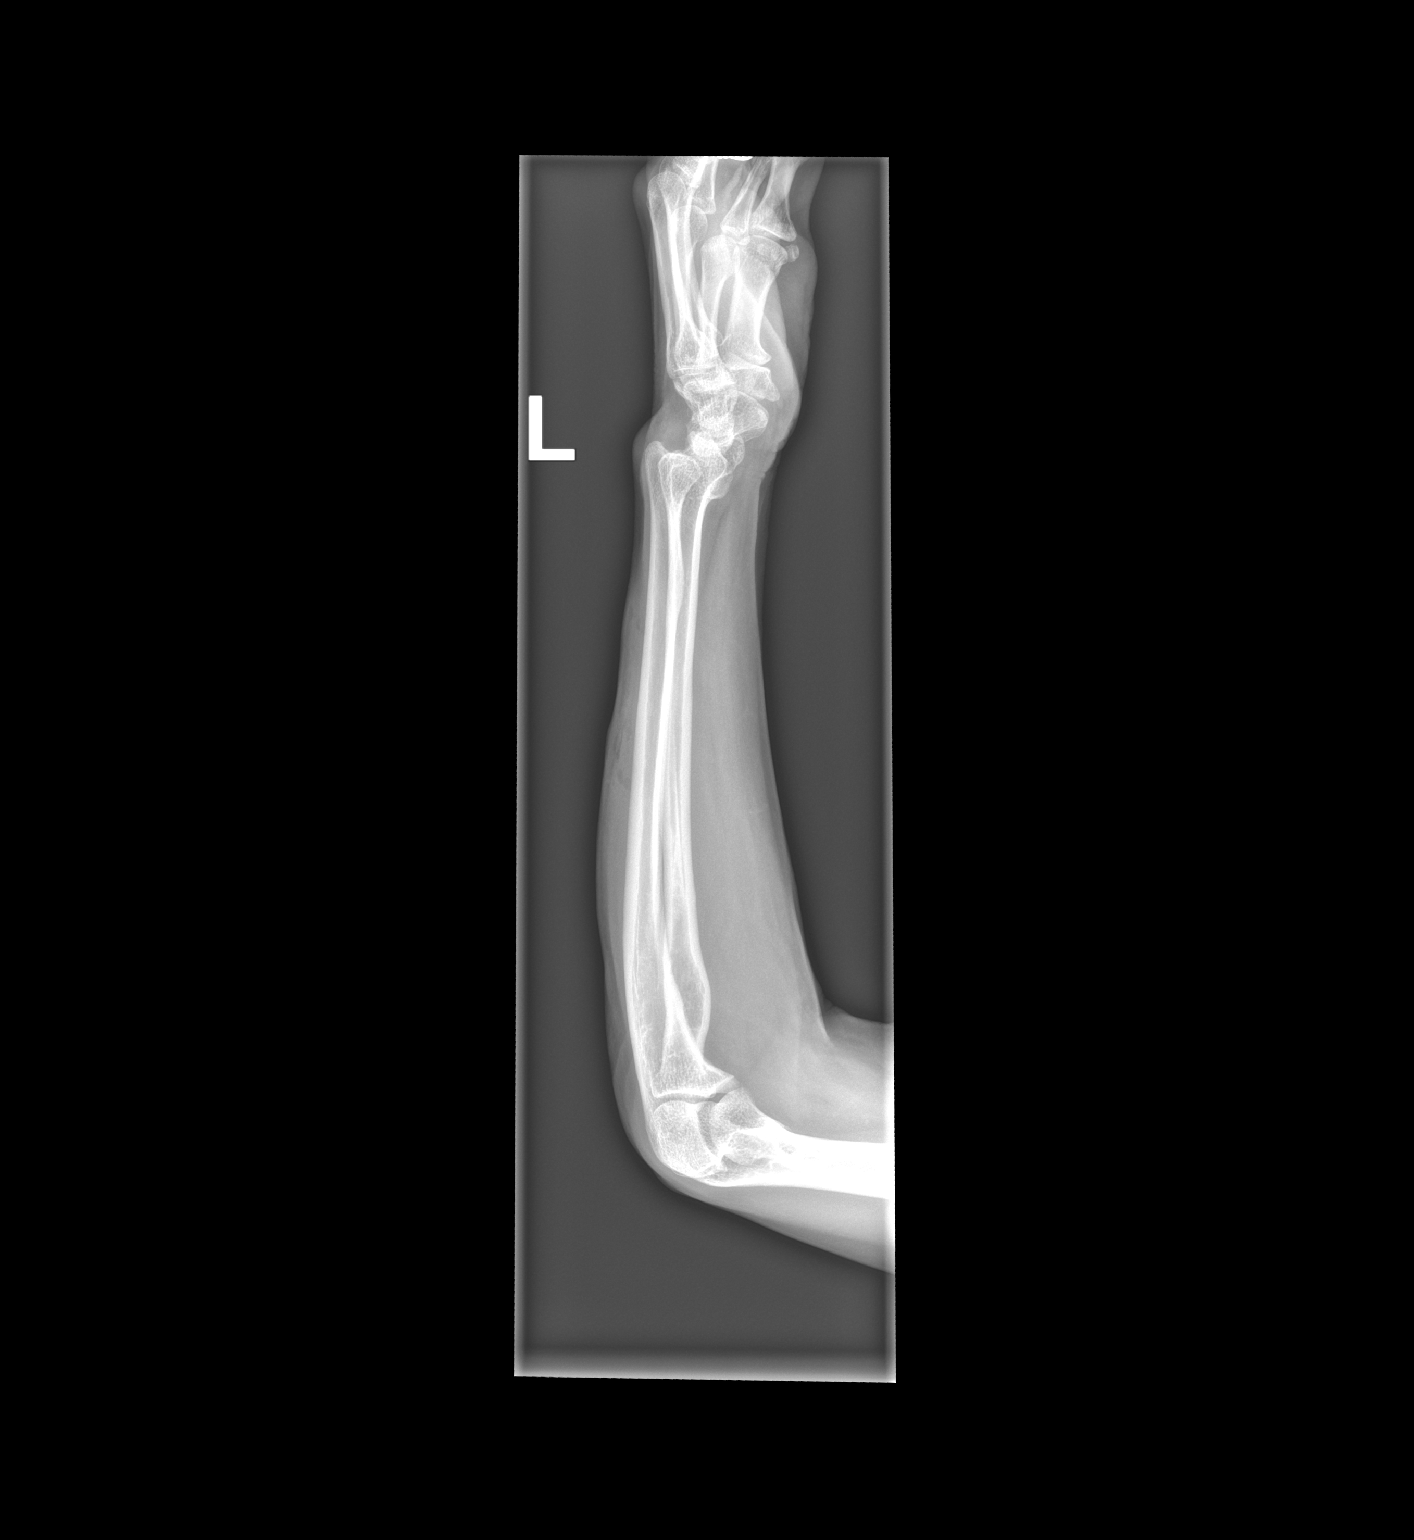

[x forearm lat left]
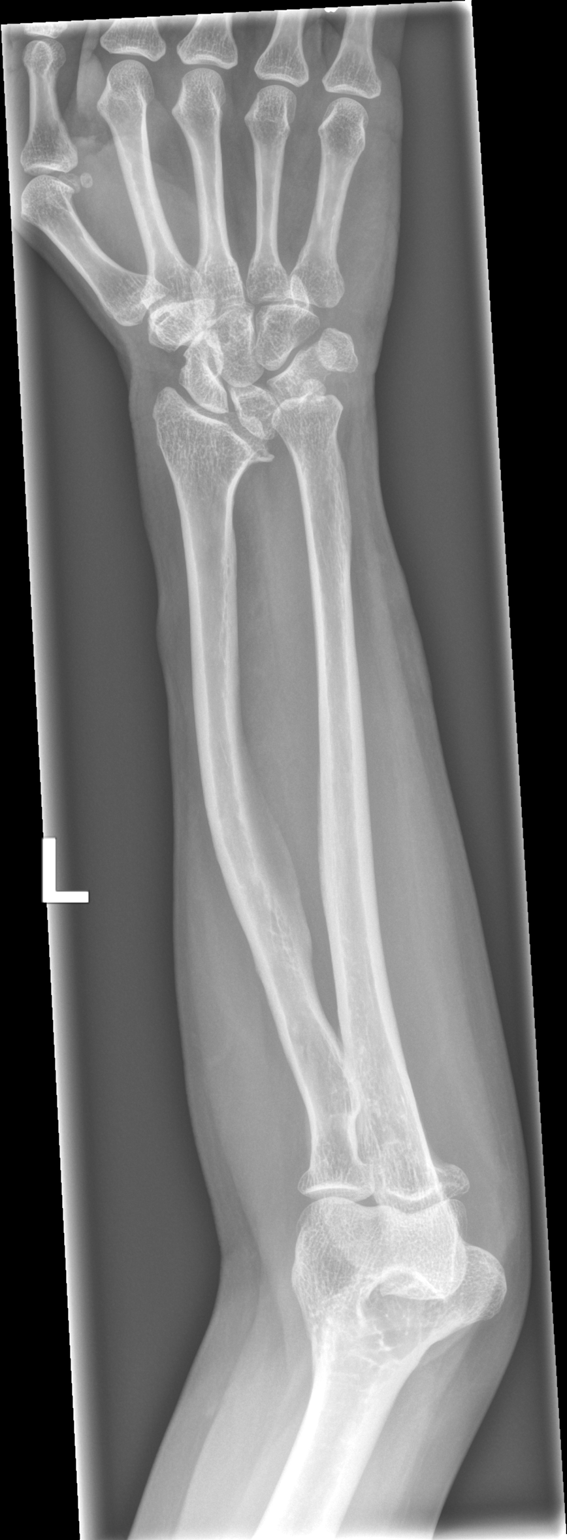

[2 of 2 positions shown; findings below may reference images not displayed]

FINDINGS: BONES/JOINTS:  Remote appearing fractures in the distal radius. Limited/incomplete evaluation.

There is NO evidence of acute diaphyseal/long bone fractures.

Limited evaluation of the joints. If indicated, specific examination should be obtained.

SOFT TISSUES:  Minimal subcutaneous soft tissue air in the mid forearm.
IMPRESSION: - There is NO evidence of acute diaphyseal/long bone fractures.

- Minimal subcutaneous soft tissue air in the mid forearm.

Tech Notes:

pt c/o dog bite. 2 superficial puncture wounds on the dorsal aspect of the left midforearm with a 1
cm area of ecchymosis on the volar aspect mid forearm. hx of wrist deformity. AK

## 2022-10-28 ENCOUNTER — Encounter: Admit: 2022-10-28 | Discharge: 2022-10-28 | Payer: MEDICAID

## 2022-10-29 ENCOUNTER — Encounter: Admit: 2022-10-29 | Discharge: 2022-10-29 | Payer: MEDICAID

## 2022-10-29 DIAGNOSIS — Z136 Encounter for screening for cardiovascular disorders: Secondary | ICD-10-CM

## 2022-10-29 DIAGNOSIS — R079 Chest pain, unspecified: Secondary | ICD-10-CM

## 2022-10-29 DIAGNOSIS — E7800 Pure hypercholesterolemia, unspecified: Secondary | ICD-10-CM

## 2022-10-29 DIAGNOSIS — M1990 Unspecified osteoarthritis, unspecified site: Secondary | ICD-10-CM

## 2022-10-29 DIAGNOSIS — F419 Anxiety disorder, unspecified: Secondary | ICD-10-CM

## 2022-10-29 NOTE — Patient Instructions
Follow-up as need     Follow up as directed.  Call sooner if issues.  Call the [place] line at [phone].  Leave a detailed message for the nurse in [place] with how we can assist you and we will call you back.

## 2022-10-29 NOTE — Progress Notes
Date of Service: 10/29/2022    Erica Mathis is a 57 y.o. female.       Chief Complaint: Follow-up    History of Present Illness:     I had the pleasure of seeing Erica Mathis in our [place] office this afternoon for cardiovascular followup.      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  I first met Erica Mathis in December of 2020, when she was experiencing some chest discomfort.  She described an atypical chest pain, which was sharp in nature and located over her middle chest.  It became so severe that she had been seen in the emergency department.  There was resolution after receiving nitroglycerin and morphine.  She had some associated shortness of breath.  She had a transthoracic echocardiogram which revealed normal left ventricular systolic function, EF 70%.  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  Mild to moderate mitral valve regurgitation.  Erica Mathis also had a stress myocardial perfusion study completed 04/18/2020.  This demonstrated homogenous radiotracer uptake with no evidence of myocardial ischemia.  Normal left ventricular systolic function, EF 72%.     It has been a couple years since my last visit with Erica Mathis.  She has stopped taking metoprolol.  She tells me she is felt great.  No issues with palpitations.  No further episodes of chest discomfort.  Honestly, this is good as she is felt in a long time.  She is not having shortness of breath, lightheadedness, dizziness, orthopnea, paroxysmal nocturnal dyspnea, or lower extremity swelling.       Past Medical History:  Patient Active Problem List    Diagnosis Date Noted    Anxiety 07/13/2019    Chest pain 07/13/2019     09/14/2015 Holter monitor: The patient was monitored for a period of 48 hours and 0 minutes. There were a total of 208493 beats with and AVG RATE of 72. The Maximum BPM was 116 with a Minimum BPM of 42. There were 4 mins < 50bpm and 0 mins > 120 bpm. WIDE BEATS totaled 346 representing 0.2% of all beats. WIDE PAIRS  Totaled 2. WIDE RUNS totaled 1. 2 SEC PAUSE  Totaled 24. NAR RUNS totaled 18. Total EARLY NAR  Were 1058 representing 0.5% of all beats.   06/06/2019 Chest xray: No radiographic evidence of an acute cardiopulmonary process.   06/06/2019 CT abdomen and pelvis with contrast (C/O epigastric/chest pain): Impression: minimal free fluid in Morison's pouch. Visualized loops of small and large bowel are normal in caliber and contour. The appendix appears unremarkable. There is a 2.6 x 2.9 right adnexal cyst.  06/07/2019 Transthoracic echocardiograph: Conclusions: 1. Normal LV systolic function. 2. Frequent PVC's. 3. Mild, bileaflet mitral valve prolapse. 4. Mild to moderate mitral valve regurgitation. 5. Mild tricuspid valve regurgitation.            Review of Systems   Constitutional: Negative.   HENT: Negative.     Eyes: Negative.    Cardiovascular: Negative.    Respiratory: Negative.     Endocrine: Negative.    Hematologic/Lymphatic: Negative.    Skin: Negative.    Musculoskeletal: Negative.    Gastrointestinal: Negative.    Genitourinary: Negative.    Neurological: Negative.    Psychiatric/Behavioral: Negative.     Allergic/Immunologic: Negative.      Vitals:    10/29/22 1401   BP: 109/79   BP Source: Arm, Right  Upper   Pulse: 76   SpO2: 98%   O2 Device: None (Room air)   PainSc: Zero   Weight: 40.8 kg (90 lb)   Height: 144.8 cm (4' 9)     Body mass index is 19.48 kg/m?Marland Kitchen    Physical Examination:  General Appearance: No acute distress. Fully alert and oriented.  Skin: Warm. No ulcers or xanthomas.   HEENT: Grossly unremarkable. Lips and oral mucosa without pallor or cyanosis. Moist mucous membranes.   Neck Veins: Normal jugular venous pressure. Neck veins are not distended.  Carotid Arteries: Normal carotid upstroke bilaterally. No bruits.  Chest Inspection: Chest is normal in appearance.  Auscultation/Percussion: Normal respiratory effort. Lungs clear to auscultation bilaterally. No wheezes, rales, or rhonchi.    Cardiac Rhythm: Regular rhythm. Normal rate.  Cardiac Auscultation: Normal S1 & S2. No S3 or S4. No rub.  Murmurs: No cardiac murmurs.  Peripheral Circulation: Normal peripheral circulation.   Abdominal Aorta: No abdominal aortic bruit.  Extremities: Appropriately warm to touch. No lower extremity edema.  Abdominal Exam: Soft, non-tender. No masses, no organomegaly. Normal bowel sounds.  Neurologic Exam: Neurological assessment grossly intact.       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  She feels great.  No issues with palpitations.  She has not passed out or lost consciousness.     Mild to moderate mitral valve regurgitation: Would recommend repeating an echocardiogram in 3 to 5 years.     Dyslipidemia: She is now on rosuvastatin 5 mg nightly.  She seems to be tolerating it really well.  Most recent fasting lipid panel from 10/17/2022 demonstrates an LDL of 110.  Goal DL is probably less than 161.  Would recommend repeating a fasting lipid panel in 6 months and if not at goal increasing the rosuvastatin from 5 to 10 mg nightly.     I really enjoyed seeing Erica Mathis back in the office today, and I appreciate the opportunity to take part in her care.  I look forward to seeing her back in about a year.  If I can be of any assistance in the interim, please do not hesitate to reach out with questions or concerns.            Total time spent on today's office visit was 35 minutes. This includes face-to-face in person visit with patient as well as non face-to-face time including review of the electronic medical record, outside records, labs, radiologic studies, cardiovascular studies, formulation of treatment plan, after visit summary, future disposition, personal discussions, and documentation.    Current Medications (including today's revisions)   acetaminophen SR (TYLENOL) 650 mg tablet Take one tablet by mouth every 6 hours as needed for Pain.    aspirin EC 81 mg tablet Take one tablet by mouth daily. Take with food.    diclofenac (VOLTAREN) 1 % topical gel Apply two g topically to affected area twice daily.    escitalopram oxalate (LEXAPRO) 10 mg tablet Take one tablet by mouth at bedtime daily.    nitroglycerin (NITROSTAT) 0.4 mg tablet Place one tablet under tongue every 5 minutes as needed for Chest Pain. Max of 3 tablets, call 911.    pantoprazole DR (PROTONIX) 40 mg tablet Take one tablet by mouth daily.    rosuvastatin (CRESTOR) 5 mg tablet Take one tablet by mouth daily.

## 2024-02-09 ENCOUNTER — Encounter: Admit: 2024-02-09 | Discharge: 2024-02-09 | Payer: MEDICAID

## 2024-02-17 ENCOUNTER — Encounter: Admit: 2024-02-17 | Discharge: 2024-02-17 | Payer: MEDICAID

## 2024-02-18 ENCOUNTER — Encounter: Admit: 2024-02-18 | Discharge: 2024-02-18 | Payer: MEDICAID

## 2024-02-23 ENCOUNTER — Encounter: Admit: 2024-02-23 | Discharge: 2024-02-23 | Payer: MEDICAID

## 2024-02-23 ENCOUNTER — Ambulatory Visit: Admit: 2024-02-23 | Discharge: 2024-02-23 | Payer: MEDICAID

## 2024-02-23 DIAGNOSIS — M778 Other enthesopathies, not elsewhere classified: Principal | ICD-10-CM

## 2024-02-23 DIAGNOSIS — M25531 Pain in right wrist: Principal | ICD-10-CM

## 2024-02-23 DIAGNOSIS — Q740 Other congenital malformations of upper limb(s), including shoulder girdle: Secondary | ICD-10-CM

## 2024-02-23 MED ORDER — TRIAMCINOLONE ACETONIDE 40 MG/ML IJ SUSP
40 mg | Freq: Once | INTRAMUSCULAR | 0 refills | Status: CP | PRN
Start: 2024-02-23 — End: ?

## 2024-02-23 MED ORDER — NAPROXEN 500 MG PO TAB
500 mg | ORAL_TABLET | Freq: Two times a day (BID) | ORAL | 0 refills | 30.00000 days | Status: AC
Start: 2024-02-23 — End: ?

## 2024-02-23 MED ORDER — LIDOCAINE (PF) 10 MG/ML (1 %) IJ SOLN
1 mL | Freq: Once | INTRAMUSCULAR | 0 refills | Status: CP | PRN
Start: 2024-02-23 — End: ?

## 2024-02-23 NOTE — Progress Notes
 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     Education:Handout Issued: Marine scientist    Orthosis:    Patient preferred the feel of the squeeze on her wrist; to wear during the day; provided wrist brace for night wear for comfort.                            Assessment:    Problems: Joint Pain    Rationale for Therapy:Clinical Judgment and Per Physician Order/Referral    Rehab Potential: Good     Contraindications to Therapy:none     Long Term Treatment Goals:     Patient will get relief from pain to improve healing and quality of life.      Short Term Treatment Goals:     Goal Number:  1  Goal: Patient to verbalize understanding of orthosis instructions including wearing schedule and precautions.  Goal Status:  Met   Goal Number:  2  Goal: Patient to verbalize/demonstrate exercises correctly.  Goal Status:  Met                           Plan of Care:    Treatment Plan:     Issue Prefabricated Orthosis  Exercise: AROM  Strengthening - Isometric    Frequency of treatment:  To follow home exercise program

## 2024-02-23 NOTE — Progress Notes
 [facility] Hand Surgery      Date of Service: 02/23/2024    Subjective:           Right wrist pain    History of Present Illness  This is a 58 year old female accompanied by her daughter presenting with a year or 2 of right wrist pain.  There was no injury.  She has noticed some deformity about her wrist her entire life but it never bothered her until recently.  She reports primarily ulnar-sided wrist pain which is worse with contact.  She does endorse some weakness in her grip secondary to pain.  She denies any loss of motion or sensory disturbances.  She has had some nonsurgical treatment elsewhere which included some injection treatments which she says did not help very much.  She was sent here for surgical opinion.  Erica Mathis is a 58 y.o. female.     Review of Systems      Objective:          acetaminophen SR (TYLENOL) 650 mg tablet Take one tablet by mouth every 6 hours as needed for Pain.    aspirin EC 81 mg tablet Take one tablet by mouth daily. Take with food.    diclofenac (VOLTAREN) 1 % topical gel Apply two g topically to affected area twice daily.    escitalopram oxalate (LEXAPRO) 10 mg tablet Take one tablet by mouth at bedtime daily.    nitroglycerin (NITROSTAT) 0.4 mg tablet Place one tablet under tongue every 5 minutes as needed for Chest Pain. Max of 3 tablets, call 911.    pantoprazole DR (PROTONIX) 40 mg tablet Take one tablet by mouth daily.    rosuvastatin  (CRESTOR ) 5 mg tablet Take one tablet by mouth daily.     Vitals:    02/23/24 1309   PainSc: Five   Weight: 40.8 kg (90 lb)     Body mass index is 19.48 kg/m?SABRA     Physical Exam  Constitutional:       General: She is not in acute distress.     Appearance: Normal appearance.   Neurological:      Mental Status: She is oriented to person, place, and time.   Psychiatric:         Behavior: Behavior normal.       Ortho Exam  Right wrist: The skin is intact, there are no wounds or scars.  There is prominence of the ulnar head bilaterally.  She has well-preserved range of motion of the wrist in all planes.  Pronation and supination is well-preserved.  She has some tenderness over the extensor carpi ulnaris tendon around the area of the ulnar head and nowhere else.  The DRUJ is stable.  She has intact sensation and brisk cap refill to all digits.       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  Her pain symptoms seem to be coming more from the extensor carpi ulnaris tendon with likely underlying tendinitis.  I counseled her that surgical treatment could be considered although it would be very aggressive is a first step.  I am certainly suspicious is the reason she is having pain is not coming from the wrist itself but rather tendinitis.  We talked about a surgical option which would be a proximal row carpectomy as well as excision of the ulnar head, I think this can be avoided.  I would  recommend she try another steroid injection as well as some therapy directed at wrist tendinitis with bracing as appropriate.  I will also provide some oral anti-inflammatories.  She is welcome to follow-up on an as-needed basis.    Tendon Injection  Location: Wrist - R wrist extensor sheath    Consent:   Consent obtained: verbal  Consent given by: patient  Risks discussed: Bleeding, Damage to surrounding structures, Infection and Pain  Alternatives discussed: delayed treatment, alternative treatment, no treatment and referral     Universal Protocol:  Relevant documents: relevant documents present and verified  Patient identity confirmed: Patient identify confirmed verbally with patient.          Procedures Details:        JOINT LOCATION: Wrist  SITE: R wrist extensor       Indications: pain   Prep: alcohol  Approach: ulnar  Medications administered: 40 mg triamcinolone  acetonide 40 mg/mL; 1 mL lidocaine  PF 1% (10 mg/mL)  Patient tolerance: Patient tolerated the procedure well with no immediate complications. Pressure was applied, and hemostasis was accomplished.  Comments: Right wrist extensor carpi ulnaris tendon sheath                                               Donnice Boehringer, MD  Associate Professor  Orthopedic Hand Surgery

## 2024-04-07 ENCOUNTER — Encounter: Admit: 2024-04-07 | Discharge: 2024-04-07 | Payer: MEDICAID

## 2024-06-15 ENCOUNTER — Encounter: Admit: 2024-06-15 | Discharge: 2024-06-15 | Payer: MEDICAID
